# Patient Record
Sex: Female | Born: 1960 | Hispanic: Yes | Marital: Single | Smoking: Never smoker
Health system: Southern US, Community
[De-identification: ages and names within clinical notes are randomized; demographics above are authoritative.]

## PROBLEM LIST (undated history)

## (undated) DIAGNOSIS — T7840XA Allergy, unspecified, initial encounter: Secondary | ICD-10-CM

## (undated) DIAGNOSIS — E785 Hyperlipidemia, unspecified: Secondary | ICD-10-CM

## (undated) HISTORY — PX: HERNIA REPAIR: SHX51

## (undated) HISTORY — DX: Hyperlipidemia, unspecified: E78.5

## (undated) HISTORY — DX: Allergy, unspecified, initial encounter: T78.40XA

---

## 2010-08-10 ENCOUNTER — Other Ambulatory Visit: Payer: Self-pay | Admitting: Internal Medicine

## 2010-08-11 ENCOUNTER — Ambulatory Visit
Admission: RE | Admit: 2010-08-11 | Discharge: 2010-08-11 | Disposition: A | Discharge status: Home / Self Care | Payer: No Typology Code available for payment source | Source: Ambulatory Visit | Attending: Internal Medicine | Admitting: Internal Medicine

## 2011-08-09 IMAGING — US US ABDOMEN COMPLETE
1 series · 13 of 25 positions shown · non-contrast
Comparison: None.

CLINICAL DATA: Fall injury with rib fractures, right upper
quadrant abdominal pain.

COMPLETE ABDOMINAL ULTRASOUND

[Series 1: us abdomen complete · 0.30mm/px · 13 of 71 slices shown]
[im 1/71]
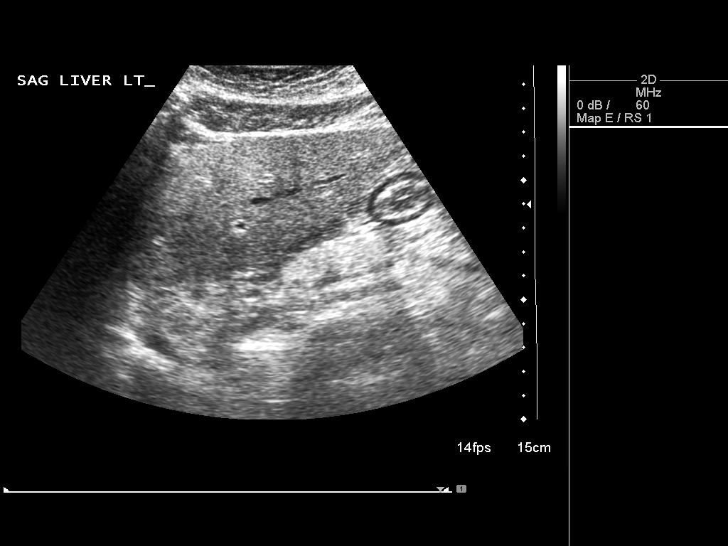
[im 6/71]
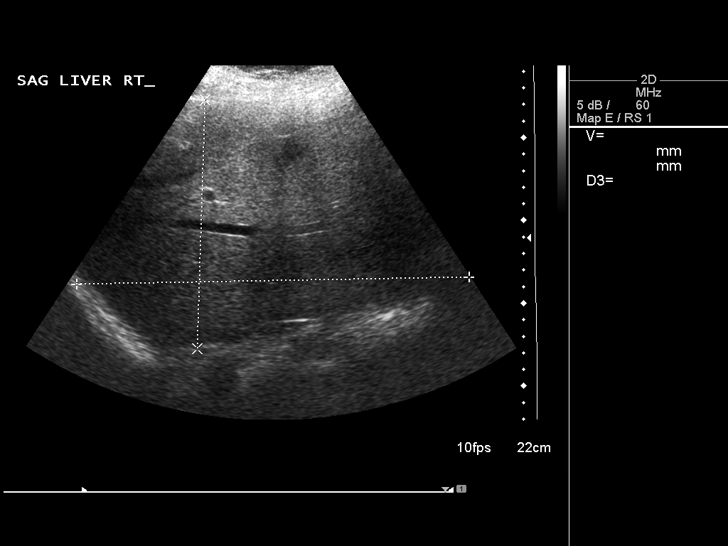
[im 12/71]
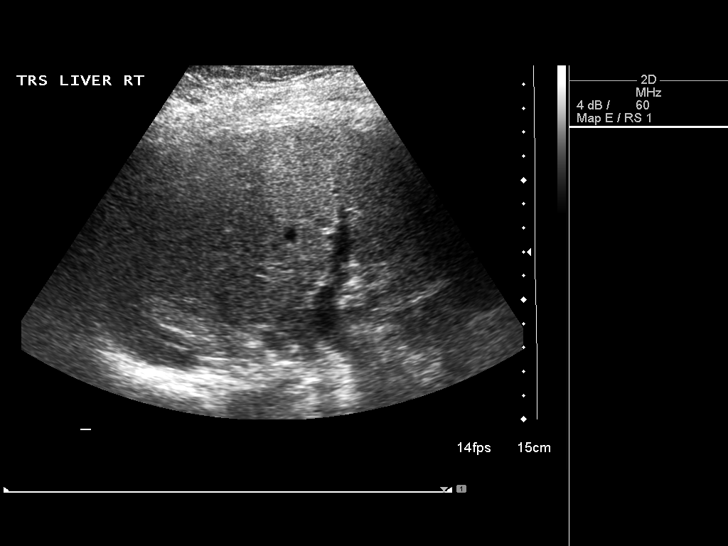
[im 18/71]
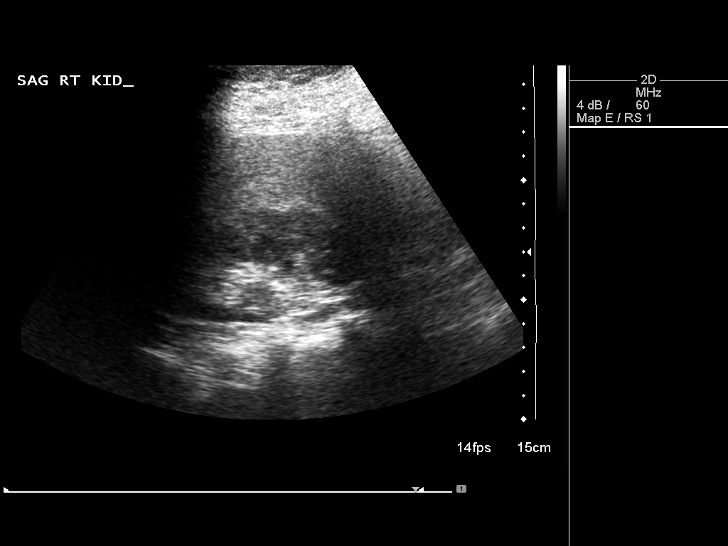
[im 24/71]
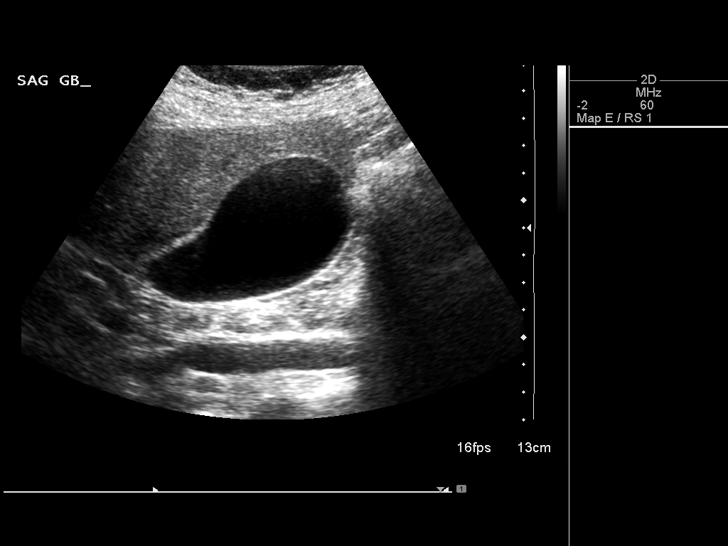
[im 30/71]
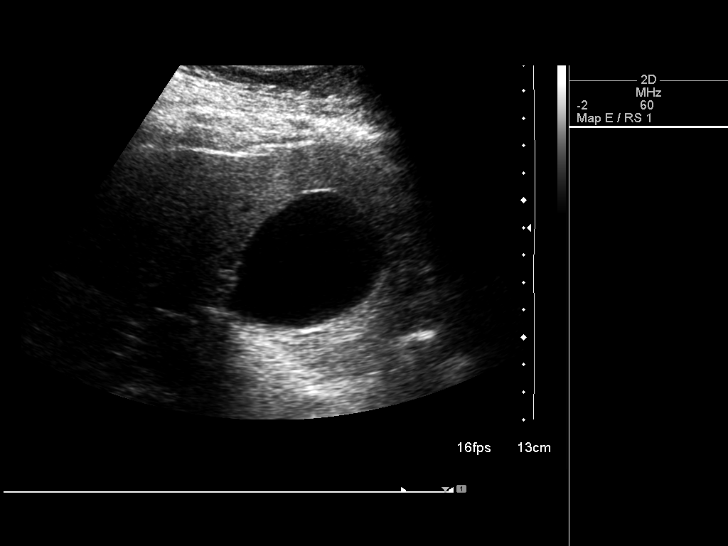
[im 36/71]
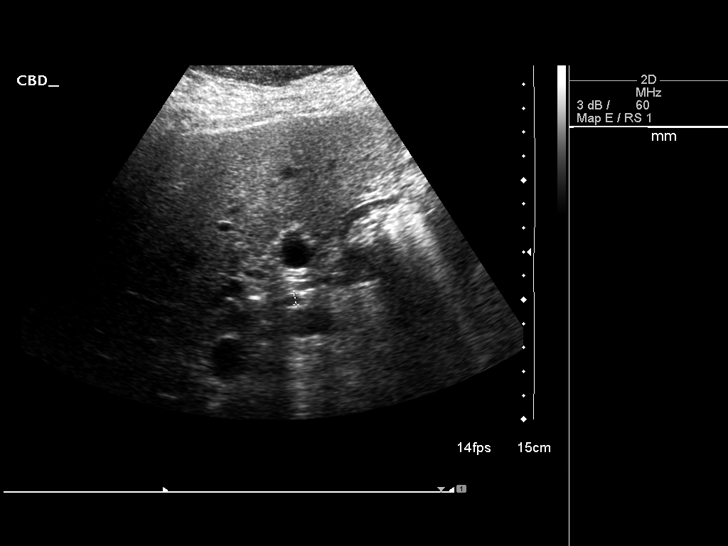
[im 41/71]
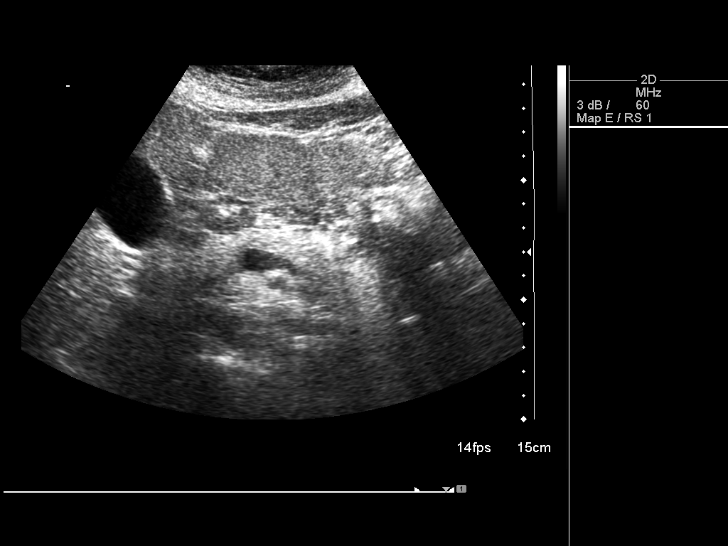
[im 47/71]
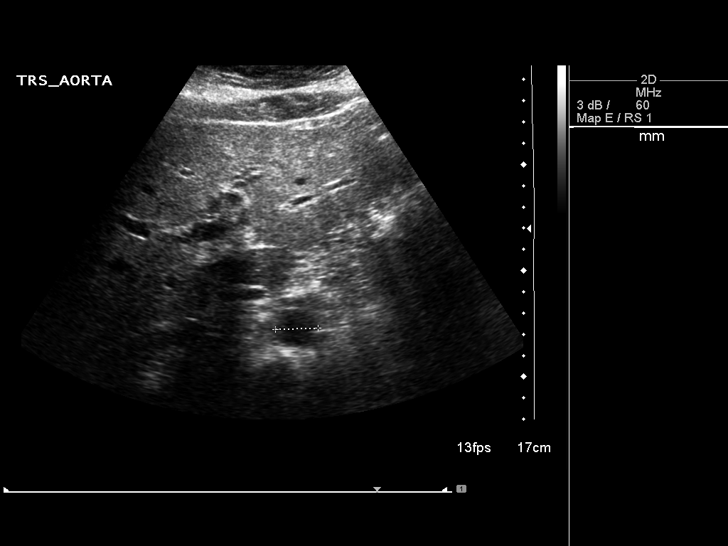
[im 53/71]
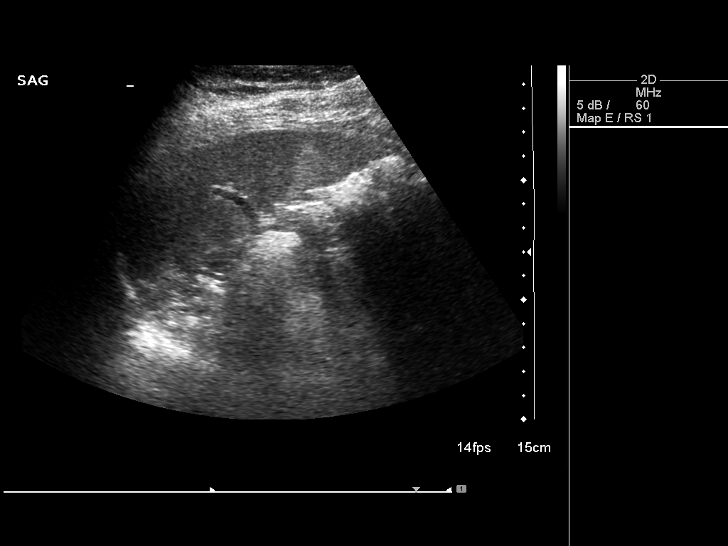
[im 59/71]
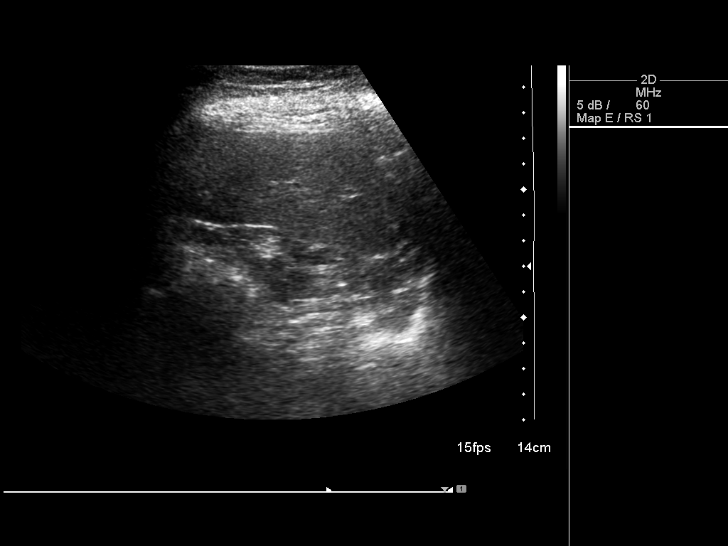
[im 65/71]
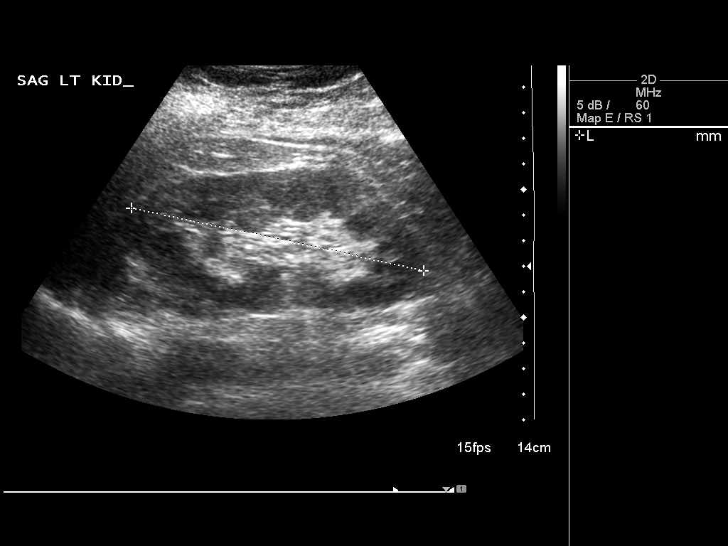
[im 71/71]
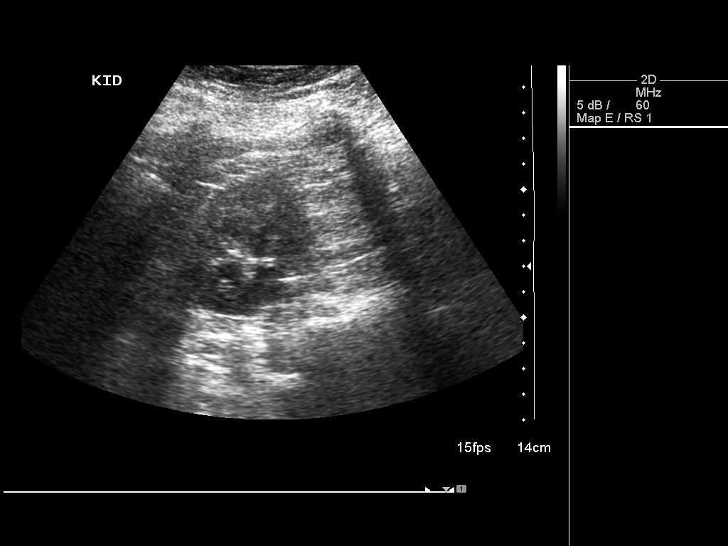

[13 of 25 positions shown; findings below may reference images not displayed]

FINDINGS: Gallbladder:  Sonographically normal without sludge or gallstones
with normal 2 mm wall thickness and no sonographic Murphy's sign
evoked.

Common bile duct:  No dilated intrahepatic or extrahepatic bile
ducts with common bile duct measuring normally at 5 mm.

Liver:  Liver size borderline to slightly enlarged (volume of 5111
ml) with diffuse increase echogenicity of slight fatty change with
no focal lesion.

IVC:  Appears normal.

Pancreas:  Diffuse fatty change with no focal lesion.

Spleen:  Sonographically normal measuring 11.3 cm long.

Right Kidney:  Lower limits of normal in size and otherwise
sonographically normal measuring 9.0 cm long.

Left Kidney:  Sonographically normal measuring 11.7 cm long.

Abdominal aorta:  Limited visualization due to overlying
gastrointestinal gas with maximum visualized abdominal aorta
measuring normally approximately at 2.0 cm.

No free fluid.
IMPRESSION: 1.  Liver size borderline to slightly enlarged with diffuse fatty
infiltration.
2.  Limited visualization of abdominal aorta.
3.  Otherwise, no significant abnormality.

## 2011-12-16 ENCOUNTER — Ambulatory Visit: Payer: Self-pay | Admitting: Physician Assistant

## 2011-12-16 VITALS — BP 147/85 | HR 81 | Temp 98.5°F | Resp 16 | Ht <= 58 in | Wt 176.0 lb

## 2011-12-16 DIAGNOSIS — T148XXA Other injury of unspecified body region, initial encounter: Secondary | ICD-10-CM

## 2011-12-16 DIAGNOSIS — W540XXA Bitten by dog, initial encounter: Secondary | ICD-10-CM

## 2011-12-16 DIAGNOSIS — S81809A Unspecified open wound, unspecified lower leg, initial encounter: Secondary | ICD-10-CM

## 2011-12-16 MED ORDER — AMOXICILLIN-POT CLAVULANATE 875-125 MG PO TABS: 1.0000 | ORAL_TABLET | Freq: Two times a day (BID) | ORAL | Status: DC

## 2011-12-16 NOTE — Progress Notes (Signed)
   Patient ID: Martha Mejia MRN: [id number], DOB: [id number], 51 y.o. Date of Encounter: 12/16/2011, 9:48 AM  Primary Physician: Tally Due, MD  Chief Complaint: Dog bite  HPI: 51 y.o. year old female with history below presents with dog bite to the right thigh. Patient was trying to put the leash and a sweater on her 51 year old [place] poodle this morning to her the dog for a walk. While doing this the dog bit the patient on the right thigh. Patient's son who is here to interpret states the dog is current on all vaccines including rabies. Dog was just at the vet this past Monday. Vet is [facility] on [address]. Unsure of last tetanus vaccine.    Past Medical History  Diagnosis Date  . Allergy      Home Meds: Prior to Admission medications   Not on File    Allergies: No Known Allergies  History   Social History  . Marital Status: Single    Spouse Name: N/A    Number of Children: N/A  . Years of Education: N/A   Occupational History  . Not on file.   Social History Main Topics  . Smoking status: Never Smoker   . Smokeless tobacco: Not on file  . Alcohol Use: No  . Drug Use: No  . Sexually Active: No   Other Topics Concern  . Not on file   Social History Narrative  . No narrative on file     Review of Systems: Constitutional: negative for chills, fever, night sweats, weight changes, or fatigue  Dermatological: see above   Physical Exam: Blood pressure 147/85, pulse 81, temperature 98.5 F (36.9 C), temperature source Oral, resp. rate 16, height 4' 9.5" (1.461 m), weight 176 lb (79.833 kg), SpO2 97.00%., Body mass index is 37.43 kg/(m^2). General: Well developed, well nourished, in no acute distress. Head: Normocephalic, atraumatic, eyes without discharge, sclera non-icteric, nares are without discharge. Bilateral auditory canals clear, TM's are without perforation, pearly grey and translucent with reflective cone of light  bilaterally. Oral cavity moist, posterior pharynx without exudate, erythema, peritonsillar abscess, or post nasal drip.  Neck: Supple. No thyromegaly. Full ROM. No lymphadenopathy. Lungs: Breathing is unlabored. Heart: Regular rate. Msk:  Strength and tone normal for age. Extremities/Skin: Puncture wound to the right quad. No active bleeding. No ecchymosis. No TTP.  Neuro: Alert and oriented X 3. Moves all extremities spontaneously. Gait is normal. CNII-XII grossly in tact. Psych:  Responds to questions appropriately with a normal affect.     ASSESSMENT AND PLAN:  51 y.o. year old female with puncture wound to anterior right thigh secondary to dog bite -TDaP given in office today -Augmentin 875/125 mg 1 po bid #20 no RF -Bite report completed and faxed to [facility] animal control -Wound washed and dressed -Patient's son states the dog is up to date with rabies vaccination. Dog was just at the vet this past Monday and was told all vaccines are current. He will have [facility] in [address] fax [place] a copy of current rabies vaccine on 12/17/11.  -Discussed with Dr. Milus Glazier   Signed, Eula Listen, PA-C 12/16/2011 9:48 AM

## 2012-01-13 ENCOUNTER — Emergency Department (HOSPITAL_COMMUNITY)
Admission: EM | Admit: 2012-01-13 | Discharge: 2012-01-13 | Disposition: A | Discharge status: Home / Self Care | Payer: Self-pay | Attending: Emergency Medicine | Admitting: Emergency Medicine

## 2012-01-13 DIAGNOSIS — Z79899 Other long term (current) drug therapy: Secondary | ICD-10-CM | POA: Insufficient documentation

## 2012-01-13 DIAGNOSIS — J329 Chronic sinusitis, unspecified: Secondary | ICD-10-CM | POA: Insufficient documentation

## 2012-01-13 DIAGNOSIS — R220 Localized swelling, mass and lump, head: Secondary | ICD-10-CM | POA: Insufficient documentation

## 2012-01-13 DIAGNOSIS — J029 Acute pharyngitis, unspecified: Secondary | ICD-10-CM | POA: Insufficient documentation

## 2012-01-13 DIAGNOSIS — J069 Acute upper respiratory infection, unspecified: Secondary | ICD-10-CM | POA: Insufficient documentation

## 2012-01-13 DIAGNOSIS — R221 Localized swelling, mass and lump, neck: Secondary | ICD-10-CM | POA: Insufficient documentation

## 2012-01-13 MED ORDER — AMOXICILLIN-POT CLAVULANATE 875-125 MG PO TABS: 1.0000 | ORAL_TABLET | Freq: Two times a day (BID) | ORAL | Status: DC

## 2012-01-13 NOTE — ED Provider Notes (Signed)
History     CSN: [id number]  Arrival date & time 01/13/12  0905   First MD Initiated Contact with Patient 01/13/12 [phone]      Chief Complaint  Patient presents with  . Allergies    (Consider location/radiation/quality/duration/timing/severity/associated sxs/prior treatment) HPI Comments: 52 year old Spanish-speaking female presents to the emergency department with her son who is translating for her complaining of head congestion, sore throat and productive cough with yellow mucus worsening over the past week and a half. Initially she thought she had allergies, went to her primary care doctor who prescribed her loratadine in nasal spray. States is not making any difference. She has an appointment with an allergist next week. Denies fever, chills, nausea or vomiting. No chest pain shortness of breath. Denies sick contacts.  The history is provided by the patient and a relative. The history is limited by a language barrier.    Past Medical History  Diagnosis Date  . Allergy     Past Surgical History  Procedure Date  . Hernia repair     No family history on file.  History  Substance Use Topics  . Smoking status: Never Smoker   . Smokeless tobacco: Not on file  . Alcohol Use: No    OB History    Grav Para Term Preterm Abortions TAB SAB Ect Mult Living                  Review of Systems  Constitutional: Negative for fever and chills.  HENT: Positive for congestion, sore throat, rhinorrhea, postnasal drip and sinus pressure. Negative for trouble swallowing.   Eyes: Negative.   Respiratory: Positive for cough. Negative for shortness of breath.   Cardiovascular: Negative for chest pain.  Gastrointestinal: Negative for nausea and vomiting.  Genitourinary: Negative.   Musculoskeletal: Negative for myalgias and arthralgias.  Skin: Negative.   Neurological: Negative for dizziness and light-headedness.    Allergies  Review of patient's allergies indicates no known  allergies.  Home Medications   Current Outpatient Rx  Name  Route  Sig  Dispense  Refill  . BACITRACIN-POLYMYXIN B 500-10000 UNIT/GM EX OINT   Topical   Apply 1 application topically 2 (two) times daily.         Marland Kitchen ZICAM ALLERGY RELIEF NA GEL   Nasal   Place 1 application into the nose daily.         Marland Kitchen LORATADINE 10 MG PO TABS   Oral   Take 10 mg by mouth daily.         Marland Kitchen SALINE NASAL SPRAY 0.65 % NA SOLN   Nasal   Place 1 spray into the nose 2 (two) times daily as needed. For congestion         . AMOXICILLIN-POT CLAVULANATE 875-125 MG PO TABS   Oral   Take 1 tablet by mouth 2 (two) times daily. One po bid x 7 days   14 tablet   0   . PRAVASTATIN SODIUM 20 MG PO TABS   Oral   Take 20 mg by mouth daily.           BP 136/74  Pulse 82  Temp 99.5 F (37.5 C) (Oral)  Resp 20  SpO2 95%  Physical Exam  Nursing note and vitals reviewed. Constitutional: She is oriented to person, place, and time. She appears well-developed and well-nourished. No distress.  HENT:  Head: Normocephalic and atraumatic.  Nose: Mucosal edema present. Right sinus exhibits maxillary sinus tenderness and frontal sinus  tenderness. Left sinus exhibits maxillary sinus tenderness and frontal sinus tenderness.  Mouth/Throat: Uvula is midline and mucous membranes are normal. Posterior oropharyngeal erythema present. No posterior oropharyngeal edema.       Post nasal drip present.  Eyes: Conjunctivae normal and EOM are normal. Pupils are equal, round, and reactive to light.  Neck: Normal range of motion. Neck supple.  Cardiovascular: Normal rate, regular rhythm and normal heart sounds.   Pulmonary/Chest: Effort normal and breath sounds normal.  Abdominal: Soft. Bowel sounds are normal. There is no tenderness.  Musculoskeletal: Normal range of motion. She exhibits no edema.  Lymphadenopathy:    She has cervical adenopathy.  Neurological: She is alert and oriented to person, place, and time.    Skin: Skin is warm and dry.  Psychiatric: She has a normal mood and affect. Her behavior is normal.    ED Course  Procedures (including critical care time)  Labs Reviewed - No data to display No results found.   1. Sinusitis   2. URI (upper respiratory infection)       MDM  52 y/o female with sinusitis/URI. Symptoms present for 1-2 weeks unrelieved by conservative measures. I have no clinical suspicion for flu. Lungs clear. Marked sinus tenderness. Post nasal drip present along with mucosal edema. Advised her to continue nasal spray and saline rinses. Rx augmentin. She will f/u with PCP and allergist. Patient and son state their understanding of plan and are agreeable.        Trevor Mace, PA-C 01/13/12 [phone]

## 2012-01-13 NOTE — ED Provider Notes (Signed)
Medical screening examination/treatment/procedure(s) were performed by non-physician practitioner and as supervising physician I was immediately available for consultation/collaboration.  Marinna Blane, MD 01/13/12 [id number] 

## 2012-01-13 NOTE — ED Notes (Signed)
Pt reports congestion; no fevers; dry cough. Pt takes loratadine and zicam and nasal spraysOTC with no relief. Pt saw GP last week and she is to see allergist.

## 2013-08-25 ENCOUNTER — Ambulatory Visit (INDEPENDENT_AMBULATORY_CARE_PROVIDER_SITE_OTHER): Payer: Self-pay | Admitting: Gynecology

## 2013-08-25 ENCOUNTER — Other Ambulatory Visit (HOSPITAL_COMMUNITY)
Admission: RE | Admit: 2013-08-25 | Discharge: 2013-08-25 | Disposition: A | Discharge status: Home / Self Care | Payer: Self-pay | Source: Ambulatory Visit | Attending: Gynecology | Admitting: Gynecology

## 2013-08-25 ENCOUNTER — Encounter: Payer: Self-pay | Admitting: Gynecology

## 2013-08-25 VITALS — BP 122/74 | Ht 59.75 in | Wt 164.4 lb

## 2013-08-25 DIAGNOSIS — R945 Abnormal results of liver function studies: Secondary | ICD-10-CM

## 2013-08-25 DIAGNOSIS — Z01419 Encounter for gynecological examination (general) (routine) without abnormal findings: Secondary | ICD-10-CM | POA: Insufficient documentation

## 2013-08-25 DIAGNOSIS — R739 Hyperglycemia, unspecified: Secondary | ICD-10-CM | POA: Insufficient documentation

## 2013-08-25 DIAGNOSIS — Z780 Asymptomatic menopausal state: Secondary | ICD-10-CM

## 2013-08-25 DIAGNOSIS — Z1151 Encounter for screening for human papillomavirus (HPV): Secondary | ICD-10-CM | POA: Insufficient documentation

## 2013-08-25 DIAGNOSIS — R635 Abnormal weight gain: Secondary | ICD-10-CM

## 2013-08-25 DIAGNOSIS — N951 Menopausal and female climacteric states: Secondary | ICD-10-CM

## 2013-08-25 DIAGNOSIS — E785 Hyperlipidemia, unspecified: Secondary | ICD-10-CM

## 2013-08-25 DIAGNOSIS — Z8639 Personal history of other endocrine, nutritional and metabolic disease: Secondary | ICD-10-CM | POA: Insufficient documentation

## 2013-08-25 DIAGNOSIS — R7309 Other abnormal glucose: Secondary | ICD-10-CM

## 2013-08-25 DIAGNOSIS — R7989 Other specified abnormal findings of blood chemistry: Secondary | ICD-10-CM

## 2013-08-25 NOTE — Addendum Note (Signed)
Addended by: Richardson ChiquitoWILKINSON, KARI S on: 08/25/2013 04:26 PM   Modules accepted: Orders

## 2013-08-25 NOTE — Patient Instructions (Addendum)
Autoexamen de [employer]  Chief Executive Officer) El autoexamen de mama le permite advertir problemas en la mama con anticipacin, cuando todava son pequeos. Haga un autoexamen de las mamas:   [employer], 5 a 7 [address] de tener el perodo ([place]).  En la misma altura de cada mes si ya no tiene ms su perodo. Busque:  Diferencias entre los senos (tamao, forma o posicin).  Cambios en el tamao o forma del seno.  Sale sangre o lquido por los pezones.  Modificaciones en los pezones (hoyuelos, movimiento del pezn).   Cambio en el color o la textura de la piel (enrojecimiento, reas escamosas). Trate de palpar:   Engrosamientos.  Bultos.  Hoyuelos.  Cualquier otro cambio. CMO REALIZAR EL AUTOEXAMEN DE MAMAS  Observe sus senos y pezones.  1. Qutese toda la ropa por encima de la cintura. 2. Prese frente a un espejo en una habitacin con buena iluminacin. 3.  Ponga las [employer] caderas y [address] las manos [place]. Palpe las mamas.  1. Acustese boca arriba o de pie en la ducha o la baera. Si est en la ducha o baera, moje y enjabone sus manos. 2. Coloque el brazo derecho por encima de la cabeza. 3. Coloque la mano izquierda en el rea de la axila derecha. 4. Haga pequeos crculos con las yemas de los dedos (no las puntas) de los 3 dedos medios. Presione ligeramente y luego haga una presin [place] y [place]. 5. Mueva los dedos un poco ms bajo y haga pequeos crculos en 3 presiones (ligera, media y [place]). 6. Siga moviendo los dedos hacia abajo y haciendo crculos Civil Service fast streamer a la parte inferior de la mama. 7. Mueva sus dedos de a un dedo de ancho hacia el centro de su cuerpo. 8. Contine haciendo crculos, esta vez hacia arriba hasta llegar a la parte inferior del cuello. 9. Mueva sus dedos de a un dedo de ancho hacia el centro de su cuerpo. 10. Haga crculos hacia abajo a partir de la parte inferior del cuello. Haga crculos hacia arriba a partir de  la parte inferior de la [place]. Detngase cuando llegue a la mitad del pecho. 11.  Repita estos pasos en la otra mama. Escriba lo que observe y se sienta normal para cada mama. Tambin anote cualquier cambio que note.  SOLICITE AYUDA DE INMEDIATO SI:   Observa cambios en las mamas o los pezones.  Observa cambios en la piel.  Tiene una secrecin anormal en los pezones.  Usted tiene un bulto nuevo.  Tiene zonas con endurecimientos inusuales. Document Released: 01/27/2010 Document Revised: 12/12/2011 [facility] Patient Information 2015 [place], [place]. This information is not intended to replace advice given to you by your health care provider. Make sure you discuss any questions you have with your health care provider.   Colonoscopa (Colonoscopy) Neomia Dear colonoscopa es un examen que se realiza para examinar todo el intestino grueso (colon). Este examen puede ayudar a Engineer, manufacturing problemas, como tumores, plipos, inflamacin y reas de Restaurant manager, fast food. El examen dura aproximadamente 1hora.  INFORME A SU MDICO:   Cualquier alergia que tenga.  Todos los Walt Disney, incluidos vitaminas, hierbas, gotas oftlmicas, cremas y [address] de [address].  Problemas previos que usted o los [employer] de su familia hayan tenido con el uso de anestsicos.  Enfermedades de [employer].  Cirugas previas.  Enfermedades patolgicas. RIESGOS Y COMPLICACIONES  En general, se trata de un procedimiento seguro. Sin embargo, Tree surgeon procedimiento, pueden surgir complicaciones. Las  complicaciones posibles son:  Hemorragias.  Desgarro o ruptura de la pared del colon.  Reaccin a los Systems developer.  Infeccin (raro). ANTES DEL PROCEDIMIENTO   Consulte a su mdico si debe cambiar o suspender los medicamentos que toma habitualmente.  Posiblemente se le recete una preparacin del colon por va oral. Esto incluye beber una gran cantidad de lquido medicinal  desde el da anterior a su procedimiento. El lquido har que elimine muchas heces blandas hasta que sean casi claras o de color verdoso claro. De esta manera limpiar el colon para prepararlo para el procedimiento.  No coma ni beba nada ms una vez que haya comenzado con la preparacin del colon, a menos que el mdico le indique que es seguro [place].  Pdale a alguna persona que la lleve a su casa luego del procedimiento. PROCEDIMIENTO   Le administrarn un medicamento para que pueda relajarse (sedante).  Se recostar de costado con las rodillas flexionadas.  Se insertar un tubo largo y flexible con Neomia Dear luz y Neomia Dear cmara en el extremo (colonoscopio) a travs del recto y dentro del colon. La cmara enviar el video hacia una pantalla de computadora a medida que se vaya moviendo por el colon. El colonoscopio tambin libera dixido de carbono para inflar el colon. Esto ayuda a que el mdico pueda ver mejor el rea.  Durante el examen, es posible que su mdico tome una pequea muestra de tejido (biopsia) para examinarla bajo el microscopio, si se encuentran anormalidades.  El examen finaliza cuando se ha examinado todo el colon. DESPUS DEL PROCEDIMIENTO   No conduzca vehculos durante las 24horas posteriores al examen.  Es posible que encuentre una pequea cantidad de sangre en la materia fecal.  Gretchen Short tenga cantidades moderadas de gases y calambres o hinchazn abdominales leves. Esto se produce a causa del gas utilizado para inflar el colon durante el examen.  Pregunte cundo estarn [employer] del examen y cmo los obtendr. Asegrese de [place]. Document Released: 10/04/2004 Document Revised: 10/15/2012 [facility] Patient Information 2015 [place], [place]. This information is not intended to replace advice given to you by your health care provider. Make sure you discuss any questions you have with your health care provider.                                                     Control del colesterol  Los niveles de colesterol en el organismo estn determinados significativamente por su dieta. Los niveles de colesterol tambin se relacionan con la enfermedad cardaca. El material que sigue ayuda a Software engineer relacin y a Chiropractor qu puede hacer para mantener su corazn sano. No todo el colesterol es [place]. Las lipoprotenas de baja densidad (LDL) forman el colesterol "malo". El colesterol malo puede ocasionar depsitos de grasa que se acumulan en el interior de las arterias. Las lipoprotenas de alta densidad (HDL) es el colesterol "bueno". Ayuda a remover el colesterol LDL "malo" de la [place]. El colesterol es un factor de riesgo muy importante para la enfermedad cardaca. Otros factores de riesgo son la hipertensin arterial, el hbito de fumar, el estrs, la herencia y [place].   El msculo cardaco obtiene el suministro de sangre a travs de las arterias coronarias. Si su colesterol LDL ("malo") est elevado y el HDL ("bueno") es [place], tiene un  factor de riesgo para que se formen depsitos de grasa en las arterias coronarias (los vasos sanguneos que suministran sangre al corazn). Esto hace que haya menos lugar para que la sangre circule. Sin la suficiente sangre y oxgeno, el msculo cardaco no puede funcionar correctamente, y usted podr sentir dolores en el pecho (angina pectoris). Cuando una arteria coronaria se cierra completamente, una parte del msculo cardaco puede morir (infarto de miocardio).  CONTROL DEL COLESTEROL Cuando el profesional que lo asiste enva la sangre al laboratorio para Artistconocer el nivel de colesterol, puede realizarle tambin un perfil completo de los lpidos. Con esta prueba, se puede determinar la cantidad total de colesterol, as como los niveles de LDL y HDL. Los triglicridos son un tipo de grasa que circula en la sangre y que tambin puede utilizarse para determinar el riesgo de enfermedad cardaca. En la siguiente tabla se  establecen los nmeros ideales: Prueba: Colesterol total  Menos de 200 mg/dl.  Prueba: LDL "colesterol malo"  Menos de 100 mg/dl.   Menos de 70 mg/dl si tiene riesgo muy elevado de sufrir un ataque cardaco o muerte cardaca sbita.  Prueba: HDL "colesterol bueno"  Mujeres: Ms de 50 mg/dl.   Hombres: Ms de 40 mg/dl.  Prueba: Trigliceridos  Menos de 150 mg/dl.    CONTROL DEL COLESTEROL CON DIETA Aunque factores como el ejercicio y el estilo de vida son importantes, la "primera lnea de ataque" es la dieta. Esto se debe a que se sabe que ciertos alimentos hacen subir el colesterol y otros lo Mexicobajan. El objetivo debe ser ConAgra Foodsequilibrar los alimentos, de modo que tengan un efecto sobre el colesterol y, an ms importante, Microbiologistreemplazar las grasas saturadas y trans con otros tipos de grasas, como las monoinsaturadas y las poliinsaturadas y cidos grasos omega-3 . En promedio, una persona no debe consumir ms de 15 a 17 g de grasas saturadas por C.H. Robinson Worldwideda. Las grasas saturadas y trans se consideran grasas "malas", ya que elevan el colesterol LDL. Las grasas saturadas se encuentran principalmente en productos animales como carne, Nomemanteca y crema. Pero esto no significa que usted Marketing executivedebe sacrificar todas sus comidas favoritas. Actualmente, como lo muestra el cuadro que figura al final de este documento, hay sustitutos de buen sabor, bajos en grasas y en colesterol, para la mayora de los alimentos que a usted Musicianle gusta comer. Elija aquellos alimentos alternativos que sean bajos en grasas o sin grasas. Elija cortes de carne del cuarto trasero o lomo ya que estos cortes son los que tienen menor cantidad de grasa y Oncologistcolesterol. El pollo (sin piel), el pescado, la carne de ternera, y la Saladopechuga de Confluencepavo molida son excelentes opciones. Elimine las carnes Tyson Foodsgrasosas como los hotdogs o el salami. Los Federal-Mogulmariscos tienen poco o nada de grasas saturadas. Cuando consuma carne Cal[place]magra, carne de aves de corral, o pescado, hgalo en  porciones de 85 gramos (3 onzas). Las grasas trans tambin se llaman "aceites parcialmente hidrogenados". Son aceites manipulados cientficamente de [place]modo que son slidos a Publishing rights managertemperatura ambiente, tienen una larga vida y Glass blower/designermejoran el sabor y la textura de los alimentos a los que se Scientist, clinical (histocompatibility and immunogenetics)agregan. Las grasas trans se encuentran en la [place]villemargarina, Daltonmasitas, crackers y alimentos horneados.  Para hornear y cocinar, el aceite es un excelente sustituto para la The Hideoutmantequilla. Los aceites monoinsaturados tienen un beneficio particular, ya que se cree que disminuyen el colesterol LDL (colesterol malo) y elevan el HDL. Deber evitar los aceites tropicales saturados como el de coco y el de Aptospalma.  Recuerde, adems, que puede comer sin restricciones los grupos de alimentos que son naturalmente libres de grasas saturadas y Neurosurgeon trans, entre los que se incluyen el pescado, las frutas (excepto el [place]), verduras, frijoles, cereales (cebada, arroz, [place], trigo) y las pastas (sin salsas con crema)   IDENTIFIQUE LOS ALIMENTOS QUE DISMINUYEN EL COLESTEROL  Pueden disminuir el colesterol las fibras solubles que estn en las frutas, como las [place], en los vegetales como el brcoli, las patatas y las zanahorias; en las legumbres como frijoles, guisantes y Therapist, occupational; y en los cereales como la cebada. Los alimentos fortificados con fitosteroles tambin Engineer, production. Debe consumir al menos 2 g de estos alimentos a diario para Financial planner de disminucin de [place].  En el supermercado, lea las etiquetas de los envases para identificar los alimentos bajos en grasas saturadas, libres de grasas trans y bajos en [place], . Elija quesos que tengan solo de 2 a 3 g de grasa saturada por onza (28,35 g). Use una margarina que no dae el corazn, [place] de grasas trans o aceite parcialmente hidrogenado. Al comprar alimentos horneados (galletitas dulces y Gaffer) evite el aceite parcialmente hidrogenado. Los panes y bollos  debern ser de granos enteros (harina de maz o de avena entera, en lugar de "harina" o "harina enriquecida"). Compre sopas en lata que no sean cremosas, con bajo contenido de sal y sin grasas adicionadas.   TCNICAS DE PREPARACIN DE LOS ALIMENTOS  Nunca fra los alimentos en aceite abundante. Si debe frer, hgalo en poco aceite y removiendo [place], porque as se utilizan muy pocas grasas, o utilice un spray antiadherente. Cuando le sea posible, hierva, hornee o ase las carnes y cocine los vegetales al vapor. En vez de [employer] con mantequilla o [place], utilice limn y hierbas, pur de Psychologist, educational y canela (para las calabazas y batatas), yogurt y salsa descremados y aderezos para ensaladas bajos en contenido graso.   BAJO EN GRASAS SATURADAS / SUSTITUTOS BAJOS EN GRASA  Carnes / Grasas saturadas (g)  Evite: Bife, corte graso (3 oz/85 g) / 11 g   Elija: Bife, corte magro (3 oz/85 g) / 4 g   Evite: Hamburguesa (3 oz/85 g) / 7 g   Elija:  Hamburguesa magra (3 oz/85 g) / 5 g   Evite: Jamn (3 oz/85 g) / 6 g   Elija:  Jamn magro (3 oz/85 g) / 2.4 g   Evite: Pollo, con piel (3 oz/85 g), Carne oscura / 4 g   Elija:  Pollo, sin piel (3 oz/85 g), Carne oscura / 2 g   Evite: Pollo, con piel (3 oz/85 g), Carne magra / 2.5 g   Elija: Pollo, sin piel (3 oz/85 g), Carne magra / 1 g  Lcteos / Grasas saturadas (g)  Evite: Leche entera (1 taza) / 5 g   Elija: Leche con bajo contenido de grasa, 2% (1 taza) / 3 g   Elija: Leche con bajo contenido de grasa, 1% (1 taza) / 1.5 g   Elija: Leche descremada (1 taza) / 0.3 g   Evite: Queso duro (1 oz/28 g) / 6 g   Elija: Queso descremado (1 oz/28 g) / 2-3 g   Evite: Queso cottage, 4% grasa (1 taza)/ 6.5 g   Elija: Queso cottage con bajo contenido de grasa, 1% grasa (1 taza)/ 1.5 g   Evite: Helado (1 taza) / 9 g   Elija: Sorbete (1 taza) / 2.5 g   Elija: Yogurt helado sin  contenido de grasa (1 taza) / 0.3 g   Elija:  Barras de fruta congeladas / vestigios   Evite: Crema batida (1 cucharada) / 3.5 g   Elija: Batidos glac sin lcteos (1 cucharada) / 1 g  Condimentos / Grasas saturadas (g)  Evite: Mayonesa (1 cucharada) / 2 g   Elija: Mayonesa con bajo contenido de grasa (1 cucharada) / 1 g   Evite: Manteca (1 cucharada) / 7 g   Elija: Margarina extra light (1 cucharada) / 1 g   Evite: Aceite de coco (1 cucharada) / 11.8 g   Elija: Aceite de oliva (1 cucharada) / 1.8 g   Elija: Aceite de maz (1 cucharada) / 1.7 g   Elija: Aceite de crtamo (1 cucharada) / 1.2 g   Elija: Aceite de girasol (1 cucharada) / 1.4 g   Elija: Aceite de soja (1 cucharada) / 2.4 g   Elija: Aceite de canola (1 cucharada) / 1 g  Document Released: 12/25/2004 Document Revised: 09/06/2010 [facility] Patient Information 2012 [place], [place]. Ejercicios para perder peso (Exercise to Lose Weight) La actividad fsica y Neomia Dear dieta saludable ayudan a perder peso. El mdico podr sugerirle ejercicios especficos. IDEAS Y CONSEJOS PARA HACER EJERCICIOS  Elija opciones econmicas que disfrute hacer , como caminar, andar en bicicleta o los vdeos para ejercitarse.   Utilice las Microbiologist del ascensor.   Camine durante la hora del almuerzo.   Estacione el auto lejos del lugar de [place] o [place].   Concurra a un gimnasio o tome clases de gimnasia.   Comience con 5  10 minutos de actividad fsica por da. Ejercite hasta 30 minutos, 4 a 6 das por [address]   Utilice zapatos que tengan un buen soporte y ropas cmodas.   Elongue antes y despus de Company secretary.   Ejercite hasta que aumente la respiracin y el corazn palpite rpido.   Beba agua extra cuando ejercite.   No haga ejercicio Firefighter, sentirse mareado o que le falte mucho el aire.  La actividad fsica puede quemar alrededor de 150 caloras.  Correr 20 cuadras en 15 minutos.   Jugar vley durante 45 a 60 minutos.   Limpiar y encerar el auto  durante 45 a 60 minutos.   Jugar ftbol americano de toque.   Caminar 25 cuadras en 35 minutos.   Empujar un cochecito 20 cuadras en 30 minutos.   Jugar baloncesto durante 30 minutos.   Rastrillar hojas secas durante 30 minutos.   Andar en bicicleta 80 cuadras en 30 minutos.   Caminar 30 cuadras en 30 minutos.   Bailar durante 30 minutos.   Quitar la nieve con una pala durante 15 minutos.   Nadar vigorosamente durante 20 minutos.   Subir escaleras durante 15 minutos.   Andar en bicicleta 60 cuadras durante 15 minutos.   Arreglar el jardn entre 30 y 45 minutos.   Saltar a la soga durante 15 minutos.   Limpiar vidrios o pisos durante 45 a 60 minutos.  Document Released: 03/31/2010 Document Revised: 09/06/2010 [facility] Patient Information 2012 [place], [place].

## 2013-08-25 NOTE — Progress Notes (Signed)
Martha Mejia 09/16/1960 [id number]   History:    53 y.o.  for annual gyn exam who is a new patient to the practice. The patient very anxious and nervous on today's exam. She stated that her last gynecological exam was in [place] 6 years ago. Patient denies any previous history of abnormal Pap smear. She stated that she reached menopause at the age of 30. She has never been on any hormone replacement therapy. She is not sexually active. Patient with no prior mammograms or colonoscopy. She had gone to an urgent care clinic here in [facility] and she did bring labs that were drawn on March 5 of this year which were nonfasting.  Blood sugar 105 SGPT elevated at 42 Lipid profile LDL elevated at 1:30, total cholesterol 214 Hemoglobin A1c elevated at 6.2 Normal TSH, and CBC and metabolic panel  There was a note on her lab results that she was going to be started on Mevacor 10 mg daily but patient never started the medication and was to return for followup in 3 months as well as in 1 month to recheck her hemoglobin A 1C and she did not followup.  Past medical history,surgical history, family history and social history were all reviewed and documented in the EPIC chart.  Gynecologic History No LMP recorded. Patient is postmenopausal. Contraception: post menopausal status Last Pap: 6 years ago. Results were: Patient states that it was normal in [place] Last mammogram: No previous study. Results were: No previous study  Obstetric History OB History  Gravida Para Term Preterm AB SAB TAB Ectopic Multiple Living  3 1   2  2   1     # Outcome Date GA Lbr Len/2nd Weight Sex Delivery Anes PTL Lv  3 TAB           2 TAB           1 PAR                ROS: A ROS was performed and pertinent positives and negatives are included in the history.  GENERAL: No fevers or chills. HEENT: No change in vision, no earache, sore throat or sinus congestion. NECK: No pain or stiffness.  CARDIOVASCULAR: No chest pain or pressure. No palpitations. PULMONARY: No shortness of breath, cough or wheeze. GASTROINTESTINAL: No abdominal pain, nausea, vomiting or diarrhea, melena or bright red blood per rectum. GENITOURINARY: No urinary frequency, urgency, hesitancy or dysuria. MUSCULOSKELETAL: No joint or muscle pain, no back pain, no recent trauma. DERMATOLOGIC: No rash, no itching, no lesions. ENDOCRINE: No polyuria, polydipsia, no heat or cold intolerance. No recent change in weight. HEMATOLOGICAL: No anemia or easy bruising or bleeding. NEUROLOGIC: No headache, seizures, numbness, tingling or weakness. PSYCHIATRIC: No depression, no loss of interest in normal activity or change in sleep pattern.     Exam: chaperone present  BP 122/74  Ht 4' 11.75" (1.518 m)  Wt 164 lb 6.4 oz (74.571 kg)  BMI 32.36 kg/m2  Body mass index is 32.36 kg/(m^2).  General appearance : Well developed well nourished female. No acute distress HEENT: Neck supple, trachea midline, no carotid bruits, no thyroidmegaly Lungs: Clear to auscultation, no rhonchi or wheezes, or rib retractions  Heart: Regular rate and rhythm, no murmurs or gallops Breast:Examined in sitting and supine position were symmetrical in appearance, no palpable masses or tenderness,  no skin retraction, no nipple inversion, no nipple discharge, no skin discoloration, no axillary or supraclavicular lymphadenopathy Abdomen: no palpable  masses or tenderness, no rebound or guarding Extremities: no edema or skin discoloration or tenderness  Pelvic: Vaginismus  Bartholin, Urethra, Skene Glands: Within normal limits             Vagina: No gross lesions or discharge  Cervix: No gross lesions or discharge  Uterus  anteverted, normal size, shape and consistency, non-tender and mobile  Adnexa  Without masses or tenderness  Anus and perineum  normal   Rectovaginal  normal sphincter tone without palpated masses or tenderness             Hemoccult  will provide her with Hemoccult cards     Assessment/Plan:  53 y.o. female for annual exam with no gynecological exam in 6 years. No prior history of mammogram or colonoscopy. Patient will be directed to get both of these studies done. Patient with history of elevated liver function tests and hyperlipidemia did not followup at the urgent care as had been recommended. She is going to return to the office later in the week for a SGOT, SGPT, fasting lipid profile and fasting blood sugar. Pap smear was done today. We'll await these results and manage accordingly. We discussed importance of monthly self breast examination.  Discussed importance of calcium vitamin D and regular exercise for osteoporosis prevention. This year she will need a bone density study. All the above was discussed with the patient in detail and in Spanish.  Note: This dictation was prepared with  Dragon/digital dictation along withSmart phrase technology. Any transcriptional errors that result from this process are unintentional.   Ok EdwardsFERNANDEZ,Araina Butrick H MD, 4:03 PM 08/25/2013

## 2013-08-26 ENCOUNTER — Other Ambulatory Visit: Payer: Self-pay

## 2013-08-26 ENCOUNTER — Other Ambulatory Visit: Payer: Self-pay | Admitting: Gynecology

## 2013-08-26 DIAGNOSIS — E7800 Pure hypercholesterolemia, unspecified: Secondary | ICD-10-CM

## 2013-08-26 LAB — LIPID PANEL
Cholesterol: 208 mg/dL — ABNORMAL HIGH (ref 0–200)
HDL: 51 mg/dL (ref >39)
LDL CALC: 131 mg/dL — AB (ref 0–99)
Total CHOL/HDL Ratio: 4.1 Ratio
Triglycerides: 130 mg/dL (ref <150)
VLDL: 26 mg/dL (ref 0–40)

## 2013-08-26 LAB — AST: AST: 23 U/L (ref 0–37)

## 2013-08-26 LAB — GLUCOSE, RANDOM: Glucose, Bld: 91 mg/dL (ref 70–99)

## 2013-08-26 LAB — ALT: ALT: 24 U/L (ref 0–35)

## 2013-08-27 LAB — CYTOLOGY - PAP

## 2013-11-09 ENCOUNTER — Encounter: Payer: Self-pay | Admitting: Gynecology

## 2013-12-25 ENCOUNTER — Ambulatory Visit: Payer: Self-pay | Admitting: Podiatry

## 2014-01-15 ENCOUNTER — Ambulatory Visit (INDEPENDENT_AMBULATORY_CARE_PROVIDER_SITE_OTHER): Payer: Self-pay | Admitting: Podiatry

## 2014-01-15 ENCOUNTER — Ambulatory Visit: Payer: Self-pay | Admitting: Podiatry

## 2014-01-15 ENCOUNTER — Encounter: Payer: Self-pay | Admitting: Podiatry

## 2014-01-15 VITALS — BP 157/87 | HR 77 | Resp 13 | Ht 59.84 in | Wt 166.0 lb

## 2014-01-15 DIAGNOSIS — M79672 Pain in left foot: Secondary | ICD-10-CM

## 2014-01-15 DIAGNOSIS — Q828 Other specified congenital malformations of skin: Secondary | ICD-10-CM

## 2014-01-15 NOTE — Progress Notes (Signed)
   Subjective:    Patient ID: Martha Mejia, female    DOB: 11/06/1960, 54 y.o.   MRN: [id number]  HPI 54 year old female presents the office today with a translator for complaints of a painful lesion on the left foot on the ball of her foot for which she points the third MTPJ area. She states of this lesion been present for approximately one year and has been increasing in symptoms. She said no prior treatment. Denies any recent swelling or redness from around the area and denies any drainage. No other complaints at this time.   Review of Systems  HENT: Positive for congestion and sinus pressure.   All other systems reviewed and are negative.      Objective:   Physical Exam AAO x3, NAD DP/PT pulses palpable bilaterally, CRT less than 3 seconds Protective sensation intact with Simms Weinstein monofilament, vibratory sensation intact, Achilles tendon reflex intact On the left plantar third metatarsophalangeal joint there is a punctate hyperkeratotic lesion. There is discomfort directly upon palpation of this lesion. Upon debridement there is no pinpoint bleeding and there is no evidence of verruca. No foreign bodies identified. No surrounding erythema, drainage/purulence, or any other clinical signs of infection. No other open lesions or pre-ulcerative lesions are identified. There is a hyperpigmented lesion on the right medial heel area which has regular borders and the color is uniform. Per the patient, it has been present since birth and has not changed.  No other areas of tenderness palpation or pain with vibratory sensation. MMT 5/5, ROM WNL.  No overlying edema, erythema, increase in warmth to bilateral lower extremities.  No pain with calf compression, swelling, warmth, erythema.       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 -Lesion was sharply  debrided without complication/bleeding to patient comfort. There is no evidence of verruca identified. -A doughnut pad was applied around the area followed by Salinocaine and an occlusive dressing. Instructions were given to the patient's first went to wash the area off or sooner if there is any discomfort to the area. If the area blisters discussed she can apply antibiotic ointment over the area and keep it covered. Monitoring signs or symptoms of infection. Discussed the area will likely recur. -Follow-up as needed. In the meantime, encouraged to call the office with any questions, concerns, change in symptoms.

## 2014-10-25 ENCOUNTER — Ambulatory Visit: Payer: Self-pay | Admitting: Pediatrics

## 2014-12-22 ENCOUNTER — Other Ambulatory Visit: Payer: Self-pay | Admitting: Neurology

## 2015-02-08 ENCOUNTER — Ambulatory Visit: Payer: Self-pay | Attending: Family Medicine

## 2015-03-10 ENCOUNTER — Ambulatory Visit (INDEPENDENT_AMBULATORY_CARE_PROVIDER_SITE_OTHER): Payer: No Typology Code available for payment source | Admitting: Family Medicine

## 2015-03-10 ENCOUNTER — Encounter: Payer: Self-pay | Admitting: Family Medicine

## 2015-03-10 VITALS — BP 147/84 | HR 71 | Temp 98.5°F | Resp 14 | Ht 59.0 in | Wt 180.0 lb

## 2015-03-10 DIAGNOSIS — IMO0001 Reserved for inherently not codable concepts without codable children: Secondary | ICD-10-CM

## 2015-03-10 DIAGNOSIS — Z1239 Encounter for other screening for malignant neoplasm of breast: Secondary | ICD-10-CM

## 2015-03-10 DIAGNOSIS — R030 Elevated blood-pressure reading, without diagnosis of hypertension: Secondary | ICD-10-CM

## 2015-03-10 DIAGNOSIS — Z8639 Personal history of other endocrine, nutritional and metabolic disease: Secondary | ICD-10-CM

## 2015-03-10 DIAGNOSIS — Z0000 Encounter for general adult medical examination without abnormal findings: Secondary | ICD-10-CM

## 2015-03-10 DIAGNOSIS — Z789 Other specified health status: Secondary | ICD-10-CM

## 2015-03-10 DIAGNOSIS — E669 Obesity, unspecified: Secondary | ICD-10-CM

## 2015-03-10 DIAGNOSIS — L700 Acne vulgaris: Secondary | ICD-10-CM

## 2015-03-10 LAB — COMPLETE METABOLIC PANEL WITH GFR
ALBUMIN: 4.3 g/dL (ref 3.6–5.1)
ALK PHOS: 60 U/L (ref 33–130)
ALT: 22 U/L (ref 6–29)
AST: 38 U/L — AB (ref 10–35)
BUN: 21 mg/dL (ref 7–25)
CO2: 21 mmol/L (ref 20–31)
CREATININE: 0.47 mg/dL — AB (ref 0.50–1.05)
Calcium: 9.1 mg/dL (ref 8.6–10.4)
Chloride: 106 mmol/L (ref 98–110)
GFR, Est African American: >89 mL/min (ref >=60)
GFR, Est Non African American: >89 mL/min (ref >=60)
GLUCOSE: 93 mg/dL (ref 65–99)
POTASSIUM: 5.9 mmol/L — AB (ref 3.5–5.3)
SODIUM: 138 mmol/L (ref 135–146)
TOTAL PROTEIN: 7.7 g/dL (ref 6.1–8.1)
Total Bilirubin: 0.4 mg/dL (ref 0.2–1.2)

## 2015-03-10 LAB — CBC WITH DIFFERENTIAL/PLATELET
Basophils Absolute: 0 10*3/uL (ref 0.0–0.1)
Basophils Relative: 0 % (ref 0–1)
EOS ABS: 0.1 10*3/uL (ref 0.0–0.7)
Eosinophils Relative: 2 % (ref 0–5)
HCT: 40.3 % (ref 36.0–46.0)
HEMOGLOBIN: 13.6 g/dL (ref 12.0–15.0)
LYMPHS ABS: 2.7 10*3/uL (ref 0.7–4.0)
Lymphocytes Relative: 39 % (ref 12–46)
MCH: 29.6 pg (ref 26.0–34.0)
MCHC: 33.7 g/dL (ref 30.0–36.0)
MCV: 87.8 fL (ref 78.0–100.0)
MONOS PCT: 8 % (ref 3–12)
MPV: 10.7 fL (ref 8.6–12.4)
Monocytes Absolute: 0.6 10*3/uL (ref 0.1–1.0)
NEUTROS PCT: 51 % (ref 43–77)
Neutro Abs: 3.5 10*3/uL (ref 1.7–7.7)
PLATELETS: 269 10*3/uL (ref 150–400)
RBC: 4.59 MIL/uL (ref 3.87–5.11)
RDW: 13.7 % (ref 11.5–15.5)
WBC: 6.9 10*3/uL (ref 4.0–10.5)

## 2015-03-10 LAB — POCT URINALYSIS DIP (DEVICE)
Bilirubin Urine: NEGATIVE
Glucose, UA: NEGATIVE mg/dL
HGB URINE DIPSTICK: NEGATIVE
KETONES UR: NEGATIVE mg/dL
Leukocytes, UA: NEGATIVE
NITRITE: NEGATIVE
PH: 8.5 — AB (ref 5.0–8.0)
PROTEIN: NEGATIVE mg/dL
Specific Gravity, Urine: 1.015 (ref 1.005–1.030)
UROBILINOGEN UA: 0.2 mg/dL (ref 0.0–1.0)

## 2015-03-10 LAB — TSH: TSH: 2.94 mIU/L

## 2015-03-10 LAB — HEMOGLOBIN A1C
Hgb A1c MFr Bld: 6 % — ABNORMAL HIGH (ref <5.7)
Mean Plasma Glucose: 126 mg/dL — ABNORMAL HIGH (ref <117)

## 2015-03-10 NOTE — Progress Notes (Signed)
Subjective:    Patient ID: Martha Mejia, female    DOB: August 31, 1960, 55 y.o.   MRN: [id number]  HPI Martha Mejia, a 55 year old female presents accompanied by son to establish care. She states that she has not had a primary provider since relocating to area. She has primarily been using urgent care for all primary needs. She also states that she has a gynecologist, Dr. Reynaldo Minium at [facility].  She primarily speaks spanish, so we will use the language line to assist with communication. She reports a history of high cholesterol. She maintains that she was never started on a cholesterol medication, but has been taking over the counter fish oil capsules. She states that she has not been following a low fat diet or exercising. Her major cardiac risk factors include obesity and a sedentary lifestyle. She denies headache, dizziness, chest pain, palpitations, or lower extremity edema.  She is also complaining of adult acne. She states that acne has been present over the past year. She states that acne occurs primarily on the chin and cheeks. She states that she has been using Rubie Maid cleanser and moisturizers to assist with symptoms without satisfactory results.  Past Medical History  Diagnosis Date  . Allergy   . Hyperlipidemia    Past Surgical History  Procedure Laterality Date  . Hernia repair    . Cesarean section    No Known Allergies  Immunization History  Administered Date(s) Administered  . Tdap 12/16/2011   Social History   Social History  . Marital Status: Single    Spouse Name: N/A  . Number of Children: N/A  . Years of Education: N/A   Occupational History  . Not on file.   Social History Main Topics  . Smoking status: Never Smoker   . Smokeless tobacco: Never Used  . Alcohol Use: No  . Drug Use: No  . Sexual Activity: No   Other Topics Concern  . Not on file   Social History Narrative    Review of Systems  Constitutional:  Negative for fever and fatigue.  HENT: Negative.   Eyes: Negative for photophobia and visual disturbance.  Respiratory: Negative.   Cardiovascular: Negative.   Gastrointestinal: Negative.   Endocrine: Negative for cold intolerance, heat intolerance, polydipsia and polyphagia.  Genitourinary: Negative.   Musculoskeletal: Negative.   Skin: Negative.        Acne primarily to face.   Neurological: Negative.  Negative for dizziness, facial asymmetry, numbness and headaches.  Hematological: Negative.   Psychiatric/Behavioral: Negative for suicidal ideas and sleep disturbance.       Objective:   Physical Exam  Constitutional: She is oriented to person, place, and time. She appears well-developed and well-nourished.  HENT:  Head: Normocephalic and atraumatic.  Right Ear: External ear normal.  Left Ear: External ear normal.  Mouth/Throat: Oropharynx is clear and moist.  Eyes: Conjunctivae and EOM are normal. Pupils are equal, round, and reactive to light.  Neck: Normal range of motion. Neck supple.  Abdominal: Soft. Bowel sounds are normal.  Musculoskeletal: Normal range of motion.  Neurological: She is alert and oriented to person, place, and time. She has normal reflexes.  Skin: Skin is warm and dry.  Open and closed comedones primarily to cheeks with mild erythema.    Psychiatric: She has a normal mood and affect. Her behavior is normal. Judgment and thought content normal.         BP 147/84 mmHg  Pulse  71  Temp(Src) 98.5 F (36.9 C) (Oral)  Resp 14  Ht  (1.499 m)  Wt 180 lb (81.647 kg)  BMI 36.34 kg/m2 Assessment & Plan:   1. Obesity (BMI 30-39.9) Recommend a lowfat, low carbohydrate diet divided over 5-6 small meals, increase water intake to 6-8 glasses, and 150 minutes per week of cardiovascular exercise. Patient given written information in Spanish.   - Hemoglobin A1c - COMPLETE METABOLIC PANEL WITH GFR - TSH - CBC with Differential  2. History of  hyperlipidemia  - Lipid Panel; Future  3. Blood pressure elevated Will return in am for a blood pressure check.   4. Acne vulgaris -Wash the face gently at least twice daily with a very mild soap -Wait at least 30 minutes after washing the face before applying topical acne medication in order to minimize the chance of skin irritation.  -Avoid heavy makeup or oily hair conditioners or scalp products -Avoid excessive touching of the face.  - metroNIDAZOLE (METROGEL) 0.75 % gel; Apply 1 application topically 2 (two) times daily.  Dispense: 45 g; Refill: 1  5. Language Barrier to communication Utilized language line to assist with communication  Routine Health Maintenance:   2013 Tetatus vaccination received after dog bite Will send referral for mammogram Repeat pap smear in 2018     Beryl Hornberger M, FNP  RTC: Will follow up in 6 weeks for acne vulgaris   The patient was given clear instructions to go to ER or return to medical center if symptoms do not improve, worsen or new problems develop. The patient verbalized understanding. Will notify patient with laboratory results.

## 2015-03-10 NOTE — Patient Instructions (Signed)
Acn (Acne) El acn es un problema de la piel que causa granos. Se manifiesta cuando los poros de la piel se obstruyen. Los poros pueden infectarse con bacterias o pueden enrojecerse, irritarse o hincharse. El acn es un problema frecuente, especialmente en los adolescentes. Esta afeccin suele desaparecer con el tiempo. CAUSAS Cada poro contiene una glndula sebcea. Las glndulas sebceas producen una sustancia grasa llamada sebo. El acn aparece cuando el sebo, las clulas muertas de la piel y la suciedad obstruyen estas glndulas. Luego, las bacterias que se encuentran normalmente en las glndulas sebceas proliferan y producen inflamacin. Con frecuencia, los cambios hormonales son factores desencadenantes del acn. Estos cambios pueden estimular el agrandamiento de las glndulas sebceas y aumentar la produccin de sebo. Algunos factores que pueden empeorar el acn son los siguientes:  Cambios hormonales durante:  La adolescencia.  Los Exxon Mobil Corporation.  El [place].  Los cosmticos y los productos capilares a base de [place].  El frotado fuerte de la piel.  Los Progress Energy.  El estrs.  Los problemas hormonales causados por ciertas enfermedades.  El cabello largo o graso que roza la piel.  Algunos medicamentos.  La presin que Verizon vinchas, las mochilas o los protectores de hombros.  Exposicin a ciertos aceites y sustancias qumicas. FACTORES DE RIESGO Es ms probable que esta afeccin se manifieste en:  [employer]Ex.  Las personas con antecedentes familiares de acn. SNTOMAS Es ms frecuente que el acn aparezca en el rostro, el cuello, el pecho y la parte superior de la espalda. Algunos sntomas son los siguientes:  Pequeas protuberancias de color rojo (granos o ppulas).  Espinillas blancas.  Espinillas negras.  Granos pequeos llenos de pus (pstulas).  Granos o pstulas grandes de color rojo que causan dolor a la palpacin. Los casos  ms graves de acn pueden producir lo siguiente:  Una zona infectada que contiene una acumulacin de pus (absceso).  Sacos duros, dolorosos y llenos de lquido (quistes).  Cicatrices. DIAGNSTICO Esta afeccin se diagnostica mediante la historia clnica y un examen fsico. Es posible que le indiquen anlisis de [place]. TRATAMIENTO El tratamiento de esta afeccin puede variar en funcin de la gravedad del acn. El tratamiento puede incluir lo siguiente:  Control and instrumentation engineer y lociones que evitan la obstruccin de las glndulas sebceas.  Cremas y lociones que tratan o previenen las infecciones y la inflamacin.  Antibiticos que se aplican en la piel o se toman como comprimidos.  Comprimidos que reducen la produccin de sebo.  Pldoras anticonceptivas.  Fototerapia o tratamientos con lser.  Ciruga.  Inyecciones de medicamentos en las zonas afectadas.  Sustancias qumicas que descaman la piel. El mdico tambin le recomendar la mejor forma de cuidarse la piel. El cuidado adecuado de la piel es el aspecto ms importante del [place]. INSTRUCCIONES PARA EL CUIDADO EN EL HOGAR Cuidado de la piel Cudese la piel como se lo haya indicado el mdico. Tal vez le indiquen que haga lo siguiente:  Lvese la piel con suavidad al menos dos veces por da y adems:  Despus de hacer ejercicios.  Antes de acostarse.  Use un jabn suave.  Aplquese un humectante a base de agua para la piel despus del lavado.  Use una pantalla o un protector solar con factor de proteccin (FPS) de30 o ms. Esto es muy importante si [place] medicamentos para Patent attorney.  Elija cosmticos que no le obstruyan las glndulas sebceas no comedgenos). Medicamentos  Baxter International de venta libre y los recetados solamente como  se lo haya indicado el mdico.  Si le recetaron un antibitico, aplqueselo o tmelo como se lo haya indicado el mdico. No deje de tomar o usar el antibitico aunque la afeccin  mejore. Instrucciones generales  Mantenga el cabello limpio y fuera del rostro. Si tiene el cabello graso, lveselo con champ con frecuencia o CarMax.  No apoye el mentn ni la frente Textron Inc.  No use vinchas ni sombreros ajustados.  No se escarbe ni se apriete los granos, ya que eso puede empeorar el acn y dejar cicatrices.  Concurra a todas las visitas de control como se lo haya indicado el mdico. Esto es importante.  Afitese con suavidad y solo cuando sea necesario.  Lleve un diario de los alimentos para determinar si hay alguno que guarda relacin con el acn. SOLICITE ATENCIN MDICA SI:  El acn no mejora despus de Psychologist, prison and probation services.  El acn [place].  Una zona grande de la piel est enrojecida o sensible.  Cree que el medicamento para tratar el acn tiene efectos secundarios.   Esta informacin no tiene Theme park manager el consejo del mdico. Asegrese de hacerle al mdico cualquier pregunta que tenga.   Document Released: 12/25/2004 Document Revised: 09/15/2014 Elsevier Interactive Patient Education 2016 [employer]. Dieta restringida en grasas y colesterol (Fat and Cholesterol Restricted Diet) El exceso de grasas y colesterol en la dieta puede causar problemas de [place]. Esta dieta lo ayudar a Pharmacologist las grasas y Print production planner en los niveles normales para evitar enfermarse. QU TIPOS DE GRASAS DEBO ELEGIR?  Elija grasas monosaturadas y polinsaturadas. Estas se encuentran en alimentos como el aceite de oliva, aceite de canola, semillas de lino, nueces, almendras y semillas.  Consuma ms grasas omega-3. Las mejores opciones incluyen salmn, caballa, sardinas, atn, aceite de lino y semillas de lino molidas.  Limite el consumo de grasas saturadas, que se encuentran en productos de origen animal, como carnes, mantequilla y crema. Tambin pueden estar en productos vegetales, como aceite de palma, de palmiste y de coco.   Evite los alimentos con  aceites parcialmente hidrogenados. Estos contienen grasas trans. Entre los ejemplos de alimentos con grasas trans se incluyen margarinas en barra, algunas margarinas untables, galletas dulces o saladas y otros productos horneados. QU PAUTAS GENERALES DEBO SEGUIR?   Lea las etiquetas de los alimentos. Busque las palabras "grasas trans" y "grasas saturadas".  Al preparar una comida:  Llene la mitad del plato con verduras y ensaladas de hojas verdes.  Llene un cuarto del plato con cereales integrales. Busque la palabra "integral" en Estate agent de la lista de ingredientes.  Llene un cuarto del plato con alimentos con protenas magras.  Limite las frutas a dos porciones por da. Elija frutas en lugar de jugos.  Coma ms alimentos con fibra soluble, por ejemplo, manzanas, brcoli, zanahorias, frijoles, guisantes y [place]. Trate de consumir de 20a 30g (gramos) de Firefighter.  Coma ms comidas caseras. Coma menos en los restaurantes y los bares.  Limite o evite el alcohol.  Limite los alimentos con alto contenido de almidn y International aid/development worker.  Limite el consumo de alimentos fritos.  Cocine los alimentos sin frerlos. Las opciones de coccin ms [place] son Development worker, community, Regulatory affairs officer, Software engineer y asar a Patent attorney.  Baje de peso si es necesario. Aunque pierda [employer], esto puede ser importante para la salud general. Tambin puede ayudar a prevenir enfermedades como diabetes y enfermedad cardaca. QU ALIMENTOS PUEDO COMER? Cereales Cereales integrales, como los panes  de salvado o integrales, las galletas, los cereales y las pastas. Avena sin endulzar, trigo, [place], quinua o arroz integral. Tortillas de harina de maz o de salvado. Verduras Verduras frescas o congeladas (crudas, al vapor, asadas o grilladas). Ensaladas de hojas verdes. Nils Pyle Nils Pyle frescas, en conserva (en su jugo natural) o frutas congeladas. Carnes y otros productos con protenas Carne de res molida (al 85% o ms [place]),  carne de res de animales alimentados con pastos o carne de res sin la grasa. Pollo o pavo sin piel. Carne de pollo o de [place]. Cerdo sin la grasa. Todos los pescados y frutos de mar. Huevos. Porotos, guisantes o lentejas secos. Frutos secos o semillas sin sal. Frijoles secos o en lata sin sal. Lcteos Productos lcteos con bajo contenido de grasas, como [place] o al 1%, quesos reducidos en grasas o al 2%, ricota con bajo contenido de grasas o [employer], o yogur natural con bajo contenido de [place]. Grasas y Hershey Company untables que no contengan grasas trans. [place] y condimentos para ensaladas livianos o reducidos en grasas. [place]. Aceites de oliva, canola, ssamo o crtamo. Mantequilla natural de cacahuate o almendra (elija la que no tenga agregado de aceite o azcar). Los artculos mencionados arriba pueden no ser Raytheon de las bebidas o los alimentos recomendados. Comunquese con el nutricionista para conocer ms opciones. QU ALIMENTOS NO SE RECOMIENDAN? Cereales Pan blanco. Pastas blancas. Arroz blanco. Pan de maz. Bagels, pasteles y croissants. Galletas saladas que contengan grasas trans. Verduras Papas blancas. MazHoover Brunette con crema o fritas. Verduras en salsa de [place]. Nils Pyle Frutas secas. Fruta enlatada en almbar liviano o espeso. Jugo de frutas. Carnes y otros productos con protenas Cortes de carne con Holiday representative. Costillas, alas de pollo, tocineta, salchicha, mortadela, salame, chinchulines, tocino, perros calientes, salchichas alemanas y embutidos envasados. Hgado y otros rganos. Lcteos [place] entera o al 2%, crema, mezcla de [place] y crema, y queso crema. Quesos enteros. Yogur entero o endulzado. Quesos con toda su grasa. Cremas no lcteas y coberturas batidas. Quesos procesados, quesos para untar o cuajadas. Dulces y postres Jarabe de maz, azcares, miel y Radio broadcast assistant. Caramelos. Mermelada y [place]. Doreen Beam. Cereales endulzados. Galletas,  pasteles, bizcochuelos, donas, muffins y helado. Grasas y [address], [place] en barra, [place] de [place], [place], [place] clarificada o grasa de tocino. Aceites de coco, de palmiste o de palma. Bebidas Alcohol. Bebidas endulzadas (como refrescos, limonadas y bebidas frutales o ponches). Los artculos mencionados arriba pueden no ser Raytheon de las bebidas y los alimentos que se Theatre stage manager. Comunquese con el nutricionista para obtener ms informacin.   Esta informacin no tiene Theme park manager el consejo del mdico. Asegrese de hacerle al mdico cualquier pregunta que tenga.   Document Released: 12/25/2004 Document Revised: 01/15/2014 Elsevier Interactive Patient Education 2016 Arvin[employer]. Hacer ejercicio para Psychiatric nurse (Exercising to [employer]) Hacer ejercicio puede ayudarlo a Publishing copy de peso. Para bajar de Whole Foods ejercicio, este debe ser de intensidad vigorosa. Puede saber que est haciendo ejercicio de intensidad vigorosa si respira con mucha dificultad y rapidez, y no puede mantener una conversacin. El ejercicio de intensidad moderada ayuda a Radio producer peso actual. Puede saber que est haciendo ejercicio de intensidad moderada si tiene una frecuencia cardaca ms elevada y [place] respiracin ms rpida, pero an puede Diplomatic Services operational officer. CON QU FRECUENCIA DEBO HACER EJERCICIO? Elija una actividad que disfrute y establezca objetivos realistas. El mdico puede ayudarlo a Runner, broadcasting/film/video de  actividades que funcione para usted. Haga ejercicio regularmente como se lo haya indicado el mdico. Esta puede incluir:  Programme researcher, broadcasting/film/video de resistencia dos veces por semana, como:  Flexiones de [place].  Abdominales.  Levantamiento de pesas.  Ejercicios con bandas elsticas.  Realizar una intensidad determinada de ejercicio durante una cantidad determinada de [place]. Elija entre estas opciones:  de ejercicio de intensidad  moderada cada semana.  de ejercicio de intensidad vigorosa cada semana.  Burlene Arnt de ejercicio de intensidad moderada y vigorosa cada semana. Los nios, las mujeres [place], las personas que no estn en forma, las personas con sobrepeso y los adultos mayores tal vez tengan que consultar a un mdico para que les d Medical laboratory scientific officer. Si tiene [employer]Illinois, asegrese de Science writer al mdico antes de comenzar un programa de ejercicios nuevo. CULES SON ALGUNAS ACTIVIDADES QUE PUEDEN AYUDARME A BAJAR DE PESO?   Caminar a un ritmo de al menos 4,67millas (7kilmetros) por hora.  Trotar o correr a un ritmo de 5 millas (8 kilmetros) por hora.  Andar en bicicleta a un ritmo de al menos 10 millas (16 kilmetros) por hora.  Practicar natacin.  Practicar patinaje sobre ruedas normales o en lnea.  Hacer esqu de fondo.  Hacer deportes competitivos vigorosos, como ftbol americano, bsquet y ftbol.  Saltar la soga.  Tomar clases de baile aerbico. CMO PUEDO SER MS ACTIVO EN MIS ACTIVIDADES DIARIAS?  Utilice las Microbiologist del ascensor.  D una caminata durante su hora de almuerzo.  Si conduce, estacione el automvil ms lejos del trabajo o de la escuela.  Si [place] transporte pblico, bjese una parada antes y camine el resto del camino.  Pngase de pie y camine cada vez que haga llamadas telefnicas.  Levntese, estrese y camine cada a lo largo del Futures trader. QU PAUTAS DEBO SEGUIR MIENTRAS HAGO EJERCICIO?  No haga ejercicio en exceso que pudiera hacer que se lastime, se sienta mareado o tenga dificultad para respirar.  Consulte al mdico antes de comenzar un programa de ejercicios nuevo.  Use ropa cmoda y calzado con buen soporte.  Beba gran cantidad de agua mientras hace ejercicios para evitar la deshidratacin o los golpes de Airline pilot. Durante la actividad fsica se pierde agua corporal que se debe reponer.  Haga ejercicio hasta  que se acelere su respiracin y sus latidos cardacos.   Esta informacin no tiene Theme park manager el consejo del mdico. Asegrese de hacerle al mdico cualquier pregunta que tenga.   Document Released: 03/31/2010 Document Revised: 01/15/2014 Elsevier Interactive Patient Education [employer]

## 2015-03-11 ENCOUNTER — Ambulatory Visit: Payer: No Typology Code available for payment source

## 2015-03-11 ENCOUNTER — Other Ambulatory Visit (INDEPENDENT_AMBULATORY_CARE_PROVIDER_SITE_OTHER): Payer: No Typology Code available for payment source

## 2015-03-11 ENCOUNTER — Encounter: Payer: Self-pay | Admitting: Family Medicine

## 2015-03-11 ENCOUNTER — Other Ambulatory Visit: Payer: Self-pay | Admitting: Family Medicine

## 2015-03-11 DIAGNOSIS — E875 Hyperkalemia: Secondary | ICD-10-CM

## 2015-03-11 DIAGNOSIS — L700 Acne vulgaris: Secondary | ICD-10-CM | POA: Insufficient documentation

## 2015-03-11 DIAGNOSIS — Z8639 Personal history of other endocrine, nutritional and metabolic disease: Secondary | ICD-10-CM

## 2015-03-11 DIAGNOSIS — R030 Elevated blood-pressure reading, without diagnosis of hypertension: Secondary | ICD-10-CM

## 2015-03-11 DIAGNOSIS — Z789 Other specified health status: Secondary | ICD-10-CM | POA: Insufficient documentation

## 2015-03-11 LAB — POTASSIUM: POTASSIUM: 4.2 mmol/L (ref 3.5–5.3)

## 2015-03-11 LAB — LIPID PANEL
CHOL/HDL RATIO: 4.3 ratio (ref <=5.0)
Cholesterol: 230 mg/dL — ABNORMAL HIGH (ref 125–200)
HDL: 53 mg/dL (ref >=46)
LDL Cholesterol: 146 mg/dL — ABNORMAL HIGH (ref <130)
Triglycerides: 153 mg/dL — ABNORMAL HIGH (ref <150)
VLDL: 31 mg/dL — AB (ref <30)

## 2015-03-11 MED ORDER — METRONIDAZOLE 0.75 % EX GEL: 1.0000 "application " | Freq: Two times a day (BID) | CUTANEOUS | Status: DC

## 2015-03-14 ENCOUNTER — Other Ambulatory Visit: Payer: Self-pay | Admitting: Family Medicine

## 2015-03-14 DIAGNOSIS — E785 Hyperlipidemia, unspecified: Secondary | ICD-10-CM

## 2015-03-14 MED ORDER — ASPIRIN EC 81 MG PO TBEC: 81.0000 mg | DELAYED_RELEASE_TABLET | Freq: Every day | ORAL | Status: AC

## 2015-03-14 MED ORDER — ATORVASTATIN CALCIUM 20 MG PO TABS: 20.0000 mg | ORAL_TABLET | Freq: Every day | ORAL | Status: DC

## 2015-03-14 NOTE — Progress Notes (Signed)
Called patient using Language line Interpreter Jake SharkHarold ID [id number] advised of labs and need to start atorvastatin 20mg  daily and Asprin 81mg  daily. Advised patient to follow diet as discussed during office visit and 3 month follow up was scheduled for 06/15/2015. Patient denied any other questions at this time. Thanks!

## 2015-03-14 NOTE — Progress Notes (Signed)
Meds ordered this encounter  Medications  . atorvastatin (LIPITOR) 20 MG tablet    Sig: Take 1 tablet (20 mg total) by mouth daily.    Dispense:  90 tablet    Refill:  1  . aspirin EC 81 MG tablet    Sig: Take 1 tablet (81 mg total) by mouth daily.    Dispense:  30 tablet    Refill:  5    Francina Beery M, FNP

## 2015-03-21 ENCOUNTER — Other Ambulatory Visit: Payer: Self-pay | Admitting: Family Medicine

## 2015-03-21 DIAGNOSIS — Z1231 Encounter for screening mammogram for malignant neoplasm of breast: Secondary | ICD-10-CM

## 2015-03-22 ENCOUNTER — Ambulatory Visit: Payer: No Typology Code available for payment source

## 2015-03-31 ENCOUNTER — Ambulatory Visit
Admission: RE | Admit: 2015-03-31 | Discharge: 2015-03-31 | Disposition: A | Discharge status: Home / Self Care | Payer: No Typology Code available for payment source | Source: Ambulatory Visit | Attending: Family Medicine | Admitting: Family Medicine

## 2015-03-31 DIAGNOSIS — Z1231 Encounter for screening mammogram for malignant neoplasm of breast: Secondary | ICD-10-CM

## 2015-04-01 ENCOUNTER — Other Ambulatory Visit: Payer: Self-pay | Admitting: Family Medicine

## 2015-04-01 DIAGNOSIS — R928 Other abnormal and inconclusive findings on diagnostic imaging of breast: Secondary | ICD-10-CM

## 2015-04-07 ENCOUNTER — Other Ambulatory Visit: Payer: Self-pay | Admitting: Family Medicine

## 2015-04-07 ENCOUNTER — Ambulatory Visit
Admission: RE | Admit: 2015-04-07 | Discharge: 2015-04-07 | Disposition: A | Discharge status: Home / Self Care | Payer: No Typology Code available for payment source | Source: Ambulatory Visit | Attending: Family Medicine | Admitting: Family Medicine

## 2015-04-07 DIAGNOSIS — R2231 Localized swelling, mass and lump, right upper limb: Secondary | ICD-10-CM

## 2015-04-07 DIAGNOSIS — R928 Other abnormal and inconclusive findings on diagnostic imaging of breast: Secondary | ICD-10-CM

## 2015-04-12 ENCOUNTER — Ambulatory Visit
Admission: RE | Admit: 2015-04-12 | Discharge: 2015-04-12 | Disposition: A | Discharge status: Home / Self Care | Payer: No Typology Code available for payment source | Source: Ambulatory Visit | Attending: Family Medicine | Admitting: Family Medicine

## 2015-04-12 DIAGNOSIS — R2231 Localized swelling, mass and lump, right upper limb: Secondary | ICD-10-CM

## 2015-04-15 ENCOUNTER — Other Ambulatory Visit: Payer: No Typology Code available for payment source

## 2015-04-29 ENCOUNTER — Ambulatory Visit: Payer: No Typology Code available for payment source | Admitting: Podiatry

## 2015-04-29 ENCOUNTER — Encounter: Payer: Self-pay | Admitting: Podiatry

## 2015-04-29 VITALS — BP 172/91 | HR 79 | Resp 18

## 2015-04-29 DIAGNOSIS — Q828 Other specified congenital malformations of skin: Secondary | ICD-10-CM

## 2015-05-01 DIAGNOSIS — Q828 Other specified congenital malformations of skin: Secondary | ICD-10-CM | POA: Insufficient documentation

## 2015-05-01 NOTE — Progress Notes (Signed)
Patient ID: Martha Mejia Maria Thome, female   DOB: 05/02/1960, 55 y.o.   MRN: [id number]  Subjective: Patient presents for callus in the bottom of her right foot. The lesion that she'll left the last upon it has resolved. Lesion the right foot is painful mostly with pressure in shoe gear. No recent injury or trauma. No stepdown foreign objects. No swelling or redness. No tingling or numbness. Denies any systemic complaints such as fevers, chills, nausea, vomiting. No acute changes since last appointment, and no other complaints at this time.   Objective: AAO x3, NAD DP/PT pulses palpable bilaterally, CRT less than 3 seconds Protective sensation intact with Dorann OuSimms Weinstein monofilament Right foot submetatarsal 2 hyperkeratotic lesion. Upon debridement as punctate annular hyperkeratotic lesion likely a porokeratosis. No drainage or pus. No underlying foreign body. No Ulceration. No areas of pinpoint bony tenderness or pain with vibratory sensation. MMT 5/5, ROM WNL. No edema, erythema, increase in warmth to bilateral lower extremities.  No open lesions or pre-ulcerative lesions.  No pain with calf compression, swelling, warmth, erythema  Assessment: Right second metatarsal two porokeratosis.  Plan: -All treatment options discussed with the patient including all alternatives, risks, complications.  -Lesion was debrided without complications or bleeding. The area was cleaned followed by a doughnut pad and salicylic acid. Post procedure instructions were discussed. Monitor percent or symptoms of infection. Follow-up if symptoms continue or return. -Patient encouraged to call the office with any questions, concerns, change in symptoms.   Ovid CurdMatthew Wagoner, DPM

## 2015-06-15 ENCOUNTER — Ambulatory Visit: Payer: No Typology Code available for payment source | Admitting: Family Medicine

## 2015-07-13 ENCOUNTER — Ambulatory Visit: Payer: No Typology Code available for payment source | Admitting: Family Medicine

## 2015-08-23 ENCOUNTER — Ambulatory Visit: Payer: No Typology Code available for payment source | Admitting: Family Medicine

## 2015-09-08 ENCOUNTER — Other Ambulatory Visit: Payer: Self-pay | Admitting: Family Medicine

## 2015-09-13 ENCOUNTER — Ambulatory Visit: Payer: No Typology Code available for payment source | Admitting: Family Medicine

## 2015-10-03 ENCOUNTER — Other Ambulatory Visit (HOSPITAL_COMMUNITY): Payer: Self-pay | Admitting: *Deleted

## 2015-10-03 DIAGNOSIS — N6310 Unspecified lump in the right breast, unspecified quadrant: Secondary | ICD-10-CM

## 2015-10-13 ENCOUNTER — Ambulatory Visit (HOSPITAL_COMMUNITY)
Admission: RE | Admit: 2015-10-13 | Discharge: 2015-10-13 | Disposition: A | Discharge status: Home / Self Care | Payer: Self-pay | Source: Ambulatory Visit | Attending: Obstetrics and Gynecology | Admitting: Obstetrics and Gynecology

## 2015-10-13 ENCOUNTER — Ambulatory Visit (HOSPITAL_COMMUNITY)
Admission: EM | Admit: 2015-10-13 | Discharge: 2015-10-13 | Disposition: A | Discharge status: Home / Self Care | Payer: No Typology Code available for payment source | Attending: Internal Medicine | Admitting: Internal Medicine

## 2015-10-13 ENCOUNTER — Ambulatory Visit
Admission: RE | Admit: 2015-10-13 | Discharge: 2015-10-13 | Disposition: A | Discharge status: Home / Self Care | Payer: No Typology Code available for payment source | Source: Ambulatory Visit | Attending: Obstetrics and Gynecology | Admitting: Obstetrics and Gynecology

## 2015-10-13 ENCOUNTER — Encounter (HOSPITAL_COMMUNITY): Payer: Self-pay

## 2015-10-13 ENCOUNTER — Encounter (HOSPITAL_COMMUNITY): Payer: Self-pay | Admitting: Emergency Medicine

## 2015-10-13 VITALS — BP 188/88 | Temp 98.5°F | Ht 59.5 in | Wt 169.0 lb

## 2015-10-13 DIAGNOSIS — N6310 Unspecified lump in the right breast, unspecified quadrant: Secondary | ICD-10-CM

## 2015-10-13 DIAGNOSIS — R030 Elevated blood-pressure reading, without diagnosis of hypertension: Secondary | ICD-10-CM

## 2015-10-13 DIAGNOSIS — Z1239 Encounter for other screening for malignant neoplasm of breast: Secondary | ICD-10-CM

## 2015-10-13 NOTE — ED Triage Notes (Signed)
[facility] for elevated BP .... Reading at their office was 190/92 and was retaken at 188/88  BP now is 179/81  Pt is asymptomatic at them moment but does recall she felt dizzy yest and felt/heard a noise in bilateral ears  Also concerned about DM as she was told she was prediabetic  A&O x4.... NAD

## 2015-10-13 NOTE — Progress Notes (Signed)
No complaints today. [facility] due to recommending a 6 month right breast diagnostic mammogram and possible ultrasound. Right breast biopsy was completed 04/12/2015, right breast ultrasound completed 04/07/2015, and bilateral screening mammogram completed 03/31/2015.  Pap Smear:  Pap smear not completed today. Last Pap smear was 08/25/2013 at Dr. Fontaine NoFernandez's office and normal with negative HPV. Per patient has no history of an abnormal Pap smear. Last Pap smear result is in EPIC.  Physical exam: Breasts Breasts symmetrical. No skin abnormalities bilateral breasts. No nipple retraction bilateral breasts. No nipple discharge bilateral breasts. No lymphadenopathy. No lumps palpated bilateral breasts. Complaints of right breast tenderness at 6 o'clock on exam. [facility] right breast diagnostic mammogram and possible ultrasound per recommendation. Appointment scheduled for Thursday, October 13, 2015 at 0910.        Pelvic/Bimanual No Pap smear completed today since last Pap smear and HPV typing was 08/25/2013. Pap smear not indicated per BCCCP guidelines.   Smoking History: Patient has never smoked.  Patient Navigation: Patient education provided. Access to services [facility] for [facility]. Spanish interpreter provided.   Colorectal Cancer Screening: Per patient has never had a colonoscopy. No complaints today.  Used Spanish interpreter [employer] [facility] [place].

## 2015-10-13 NOTE — Patient Instructions (Signed)
Explained breast self awareness with Martha Mejia. Patient did not need a Pap smear today due to last Pap smear and HPV typing was 08/25/2013. Let her know B[facility]CP will cover Pap smears and HPV typing every 5 years unless has a history of abnormal Pap smears. [facility] right breast diagnostic mammogram and possible ultrasound per recommendation. Appointment scheduled for Thursday, October 13, 2015 at 0910. Discussed with patient her high blood pressure and importance in following up with a physician today. Patient's son is going to take her to the Urgent Care after [facility] appointment and notify her PCP. Martha Mejia verbalized understanding.  Tahira Olivarez, Kathaleen Maserhristine Poll, RN 9:35 AM

## 2015-10-13 NOTE — ED Provider Notes (Signed)
[facility]    CSN: [id number] Arrival date & time: 10/13/15  1017     History   Chief Complaint Chief Complaint  Patient presents with  . Hypertension  . Diabetes    HPI Martha Mejia is a 55 y.o. female. She presents today referred by the breast Center, where she was noted to have an elevated blood pressure. No chest pain, no dyspnea, no headache. Yesterday, she had a single brief episode, 5 seconds, of lightheadedness/feeling off balance, that has resolved. Has had an elevated blood pressure observed on occasion before, but no prior history of treated blood pressure. Not taking cold medicines or energy supplements, but does drink coffee.  HPI  Past Medical History:  Diagnosis Date  . Allergy   . Hyperlipidemia     Patient Active Problem List   Diagnosis Date Noted  . Porokeratosis 05/01/2015  . Hyperlipidemia LDL goal <100 03/14/2015  . Language barrier to communication 03/11/2015  . Acne vulgaris 03/11/2015  . Elevated blood sugar 08/25/2013  . History of hyperlipidemia 08/25/2013  . Elevated liver function tests 08/25/2013    Past Surgical History:  Procedure Laterality Date  . CESAREAN SECTION    . HERNIA REPAIR      OB History    Gravida Para Term Preterm AB Living   3 1     2 1    SAB TAB Ectopic Multiple Live Births     2             Home Medications    Prior to Admission medications   Medication Sig Start Date End Date Taking? Authorizing Provider  aspirin EC 81 MG tablet Take 1 tablet (81 mg total) by mouth daily. 03/14/15  Yes Massie Maroon, FNP  Multiple Vitamin (MULTIVITAMIN) capsule Take 1 capsule by mouth daily. Reported on 03/10/2015   Yes Historical Provider, MD  Omega-3 Fatty Acids (FISH OIL) 1200 MG CPDR Take by mouth.   Yes Historical Provider, MD  Ascorbic Acid (VITAMIN C) 1000 MG tablet Take 1,000 mg by mouth daily.    Historical Provider, MD  atorvastatin (LIPITOR) 20 MG tablet Take 1 tablet (20 mg total) by mouth  daily. Patient not taking: Reported on 10/13/2015 03/14/15   Massie Maroon, FNP  Biotin 5000 MCG TABS Take by mouth.    Historical Provider, MD  metroNIDAZOLE (METROGEL) 0.75 % gel Apply 1 application topically 2 (two) times daily. Patient not taking: Reported on 10/13/2015 03/11/15   Massie Maroon, FNP  Multiple Vitamins-Minerals (OCUVITE ADULT 50+ PO) Take by mouth.    Historical Provider, MD  Vitamin D, Cholecalciferol, 1000 units TABS Take by mouth.    Historical Provider, MD    Family History Family History  Problem Relation Age of Onset  . Adopted: Yes    Social History Social History  Substance Use Topics  . Smoking status: Never Smoker  . Smokeless tobacco: Never Used  . Alcohol use No     Allergies   Review of patient's allergies indicates no known allergies.   Review of Systems Review of Systems  All other systems reviewed and are negative.    Physical Exam Triage Vital Signs ED Triage Vitals [10/13/15 1115]  Enc Vitals Group     BP 179/81     Pulse Rate 72     Resp 18     Temp 98.2 F (36.8 C)     Temp Source Oral     SpO2 99 %  Weight      Height    Updated Vital Signs BP 149/69 (BP Location: Right Arm)   Pulse 72   Temp 98.2 F (36.8 C) (Oral)   Resp 18   SpO2 99%  Physical Exam  Constitutional: She is oriented to person, place, and time. No distress.  Alert, nicely groomed  HENT:  Head: Atraumatic.  Eyes:  Conjugate gaze, no eye redness/drainage  Neck: Neck supple.  Cardiovascular: Normal rate and regular rhythm.   Pulmonary/Chest: No respiratory distress.  Lungs clear, symmetric breath sounds  Abdominal: She exhibits no distension.  Musculoskeletal: Normal range of motion.  No leg swelling  Neurological: She is alert and oriented to person, place, and time.  Skin: Skin is warm and dry.  No cyanosis  Nursing note and vitals reviewed.    UC Treatments / Results    Procedures Procedures (including critical care time)      BP rechecked with a large cuff   Final Clinical Impressions(s) / UC Diagnoses   Final diagnoses:  Elevated blood-pressure reading without diagnosis of hypertension   Many possible causes of blood pressure elevation but no consistent pattern of blood pressure being elevated at this time.   No symptoms (chest discomfort, trouble breathing, bad headache) to suggest immediate threat from blood pressure. Blood pressure rechecked at urgent care with large cuff was 149/69.   Followup with primary care provider in about a week to recheck blood pressure and discuss further.      Eustace MooreLaura W Bryah Ocheltree, MD 10/20/15 [phone]

## 2015-10-13 NOTE — Discharge Instructions (Addendum)
Many possible causes of blood pressure elevation but no consistent pattern of blood pressure being elevated at this time.   No symptoms (chest discomfort, trouble breathing, bad headache) to suggest immediate threat from blood pressure. Blood pressure rechecked at urgent care with large cuff was 149/69.   Followup with primary care provider in about a week to recheck blood pressure and discuss further.

## 2015-10-13 NOTE — ED Notes (Signed)
Pt d/c by Dr. Murray 

## 2015-10-17 ENCOUNTER — Encounter (HOSPITAL_COMMUNITY): Payer: Self-pay | Admitting: *Deleted

## 2015-11-07 ENCOUNTER — Ambulatory Visit: Payer: Self-pay | Admitting: Podiatry

## 2015-11-18 ENCOUNTER — Ambulatory Visit: Payer: No Typology Code available for payment source | Attending: Internal Medicine

## 2015-11-18 DIAGNOSIS — R030 Elevated blood-pressure reading, without diagnosis of hypertension: Secondary | ICD-10-CM | POA: Insufficient documentation

## 2015-11-25 ENCOUNTER — Ambulatory Visit (INDEPENDENT_AMBULATORY_CARE_PROVIDER_SITE_OTHER): Payer: No Typology Code available for payment source | Admitting: Family Medicine

## 2015-11-25 ENCOUNTER — Ambulatory Visit: Payer: No Typology Code available for payment source | Admitting: Family Medicine

## 2015-11-25 VITALS — BP 136/87 | HR 83 | Temp 98.7°F | Resp 18 | Ht 59.0 in | Wt 180.0 lb

## 2015-11-25 DIAGNOSIS — Z0000 Encounter for general adult medical examination without abnormal findings: Secondary | ICD-10-CM

## 2015-11-25 DIAGNOSIS — M8580 Other specified disorders of bone density and structure, unspecified site: Secondary | ICD-10-CM

## 2015-11-25 LAB — LIPID PANEL
CHOLESTEROL: 234 mg/dL — AB (ref <200)
HDL: 65 mg/dL (ref >50)
LDL Cholesterol: 142 mg/dL — ABNORMAL HIGH (ref <100)
TRIGLYCERIDES: 133 mg/dL (ref <150)
Total CHOL/HDL Ratio: 3.6 Ratio (ref <5.0)
VLDL: 27 mg/dL (ref <30)

## 2015-11-25 LAB — CBC WITH DIFFERENTIAL/PLATELET
BASOS ABS: 0 {cells}/uL (ref 0–200)
Basophils Relative: 0 %
EOS PCT: 2 %
Eosinophils Absolute: 104 cells/uL (ref 15–500)
HCT: 41.3 % (ref 35.0–45.0)
HEMOGLOBIN: 13.4 g/dL (ref 11.7–15.5)
LYMPHS ABS: 2184 {cells}/uL (ref 850–3900)
Lymphocytes Relative: 42 %
MCH: 29.3 pg (ref 27.0–33.0)
MCHC: 32.4 g/dL (ref 32.0–36.0)
MCV: 90.2 fL (ref 80.0–100.0)
MONOS PCT: 7 %
MPV: 10.7 fL (ref 7.5–12.5)
Monocytes Absolute: 364 cells/uL (ref 200–950)
NEUTROS ABS: 2548 {cells}/uL (ref 1500–7800)
NEUTROS PCT: 49 %
PLATELETS: 253 10*3/uL (ref 140–400)
RBC: 4.58 MIL/uL (ref 3.80–5.10)
RDW: 14.1 % (ref 11.0–15.0)
WBC: 5.2 10*3/uL (ref 3.8–10.8)

## 2015-11-25 LAB — COMPLETE METABOLIC PANEL WITH GFR
ALBUMIN: 4.2 g/dL (ref 3.6–5.1)
ALK PHOS: 68 U/L (ref 33–130)
ALT: 17 U/L (ref 6–29)
AST: 20 U/L (ref 10–35)
BILIRUBIN TOTAL: 0.5 mg/dL (ref 0.2–1.2)
BUN: 17 mg/dL (ref 7–25)
CALCIUM: 9.5 mg/dL (ref 8.6–10.4)
CO2: 26 mmol/L (ref 20–31)
Chloride: 104 mmol/L (ref 98–110)
Creat: 0.64 mg/dL (ref 0.50–1.05)
GFR, Est African American: >89 mL/min (ref >=60)
Glucose, Bld: 94 mg/dL (ref 65–99)
POTASSIUM: 4.4 mmol/L (ref 3.5–5.3)
Sodium: 139 mmol/L (ref 135–146)
TOTAL PROTEIN: 7.2 g/dL (ref 6.1–8.1)

## 2015-11-25 LAB — TSH: TSH: 2.84 m[IU]/L

## 2015-11-25 LAB — HEMOGLOBIN A1C
Hgb A1c MFr Bld: 5.8 % — ABNORMAL HIGH (ref <5.7)
Mean Plasma Glucose: 120 mg/dL

## 2015-11-25 MED ORDER — NAPROXEN 500 MG PO TABS: 500.0000 mg | ORAL_TABLET | Freq: Two times a day (BID) | ORAL | 1 refills | Status: AC

## 2015-11-25 NOTE — Progress Notes (Signed)
Patient is here for FU  Patient complains of right shoulder and arm pain being present for 8 months.  Patient has taken medication today. Patient had black coffee this morning.

## 2015-11-25 NOTE — Progress Notes (Signed)
Martha Mejia, is a 55 y.o. female  WUJ:[id number]:[id number]  NFA:[id number]  DOB - 03/08/1960  CC:  Chief Complaint  Patient presents with  . Follow-up       HPI: Martha BlewReyna Ashenfelter is a 55 y.o. female here  for routine follow-up. She has a history of hypercholesterolemia and is on atorvostatin. She take ascorbic acid, biotin, mvis, omega-3 fatty acids, and Vitamin d. She is requesting a bone density study as she has a history of osteopenia. She only voices complaint today is of right shoulder and arm pain. She reports aching and tenderness.   Health Maintenance: She declines flu shot today. She needs screening for HIV and Hep C. She also needs colon cancer screening. She reports a normal PAP in 2015.  No Known Allergies Past Medical History:  Diagnosis Date  . Allergy   . Hyperlipidemia    Current Outpatient Prescriptions on File Prior to Visit  Medication Sig Dispense Refill  . Ascorbic Acid (VITAMIN C) 1000 MG tablet Take 1,000 mg by mouth daily.    Marland Kitchen. aspirin EC 81 MG tablet Take 1 tablet (81 mg total) by mouth daily. 30 tablet 5  . Biotin 5000 MCG TABS Take by mouth.    . Multiple Vitamin (MULTIVITAMIN) capsule Take 1 capsule by mouth daily. Reported on 03/10/2015    . Multiple Vitamins-Minerals (OCUVITE ADULT 50+ PO) Take by mouth.    . Omega-3 Fatty Acids (FISH OIL) 1200 MG CPDR Take by mouth.    . Vitamin D, Cholecalciferol, 1000 units TABS Take by mouth.    Marland Kitchen. atorvastatin (LIPITOR) 20 MG tablet Take 1 tablet (20 mg total) by mouth daily. (Patient not taking: Reported on 11/25/2015) 90 tablet 1   No current facility-administered medications on file prior to visit.    Family History  Problem Relation Age of Onset  . Adopted: Yes   Social History   Social History  . Marital status: Single    Spouse name: N/A  . Number of children: N/A  . Years of education: N/A   Occupational History  . Not on file.   Social History Main Topics  . Smoking status: Never Smoker  . Smokeless  tobacco: Never Used  . Alcohol use No  . Drug use: No  . Sexual activity: No   Other Topics Concern  . Not on file   Social History Narrative  . No narrative on file    Review of Systems: Constitutional: Negative Skin: Negative HENT: Negative  Eyes: + blurry vision Neck: Negative Respiratory: Negative Cardiovascular: Negative Gastrointestinal: Negative Genitourinary: Negative  Musculoskeletal: Right shoulder and arm pain,history of osteopenia. Aching of legs and feet. Neurological: Negative for Hematological: Negative  Psychiatric/Behavioral: Negative    Objective:   Vitals:   11/25/15 0930  BP: 136/87  Pulse: 83  Resp: 18  Temp: 98.7 F (37.1 C)    Physical Exam: Constitutional: Patient appears well-developed and well-nourished. No distress. HENT: Normocephalic, atraumatic, External right and left ear normal. Oropharynx is clear and moist.  Eyes: Conjunctivae and EOM are normal. PERRLA, no scleral icterus. Neck: Normal ROM. Neck supple. No lymphadenopathy, No thyromegaly. CVS: RRR, S1/S2 +, no murmurs, no gallops, no rubs Pulmonary: Effort and breath sounds normal, no stridor, rhonchi, wheezes, rales.  Abdominal: Soft. Normoactive BS,, no distension, tenderness, rebound or guarding.  Musculoskeletal: Normal range of motion. No edema and no tenderness.  Neuro: Alert.Normal muscle tone coordination. Non-focal Skin: Skin is warm and dry. No rash noted. Not diaphoretic. No erythema. No  pallor. Psychiatric: Normal mood and affect. Behavior, judgment, thought content normal.  Lab Results  Component Value Date   WBC 6.9 03/10/2015   HGB 13.6 03/10/2015   HCT 40.3 03/10/2015   MCV 87.8 03/10/2015   PLT 269 03/10/2015   Lab Results  Component Value Date   CREATININE 0.47 (L) 03/10/2015   BUN 21 03/10/2015   NA 138 03/10/2015   K 4.2 03/11/2015   CL 106 03/10/2015   CO2 21 03/10/2015    Lab Results  Component Value Date   HGBA1C 6.0 (H) 03/10/2015    Lipid Panel     Component Value Date/Time   CHOL 230 (H) 03/11/2015 0832   TRIG 153 (H) 03/11/2015 0832   HDL 53 03/11/2015 0832   CHOLHDL 4.3 03/11/2015 0832   VLDL 31 (H) 03/11/2015 0832   LDLCALC 146 (H) 03/11/2015 0832        Assessment and plan:   1. Healthcare maintenance  - Hemoglobin A1c - COMPLETE METABOLIC PANEL WITH GFR - CBC with Differential - Lipid panel - HIV antibody (with reflex) - Hepatitis C Antibody - TSH  2. Osteopenia, unspecified location  - Vitamin D 1,25 dihydroxy   No Follow-up on file.  The patient was given clear instructions to go to ER or return to medical center if symptoms don't improve, worsen or new problems develop. The patient verbalized understanding.    Henrietta HooverLinda C Bria Portales FNP  11/25/2015, 12:03 PM

## 2015-11-25 NOTE — Patient Instructions (Signed)
Take naproxen regularly for 2 weeks and then as needed.

## 2015-11-26 LAB — HIV ANTIBODY (ROUTINE TESTING W REFLEX): HIV 1&2 Ab, 4th Generation: NONREACTIVE

## 2015-11-26 LAB — HEPATITIS C ANTIBODY: HCV AB: NEGATIVE

## 2015-11-28 LAB — VITAMIN D 1,25 DIHYDROXY
VITAMIN D 1, 25 (OH) TOTAL: 47 pg/mL (ref 18–72)
VITAMIN D3 1, 25 (OH): 47 pg/mL

## 2016-01-19 ENCOUNTER — Encounter: Payer: Self-pay | Admitting: Podiatry

## 2016-01-19 ENCOUNTER — Ambulatory Visit: Payer: Self-pay | Admitting: Podiatry

## 2016-01-19 VITALS — BP 174/96

## 2016-01-19 DIAGNOSIS — Q828 Other specified congenital malformations of skin: Secondary | ICD-10-CM

## 2016-01-19 NOTE — Progress Notes (Signed)
Patient ID: Martha Mejia Maria Rowlands, female   DOB: 09/04/1960, 56 y.o.   MRN: [id number]  Subjective: Ms. Donnamarie Rossettiineda presents for callus in the bottom of her right foot. She states that it went away but it has come back and is painful with shoes and pressure. Denies any swelling or redness or drainage. No recent treatment. Denies any systemic complaints such as fevers, chills, nausea, vomiting. No acute changes since last appointment, and no other complaints at this time.   Objective: AAO x3, NAD DP/PT pulses palpable bilaterally, CRT less than 3 seconds Protective sensation intact with Dorann OuSimms Weinstein monofilament Right foot submetatarsal 2 hyperkeratotic lesion. Upon debridement as punctate annular hyperkeratotic lesion likely a porokeratosis. No drainage or pus. No underlying foreign body. No underlying ulceration.  No areas of tenderness bilaterally.  No edema, erythema, increase in warmth to bilateral lower extremities.  No open lesions or pre-ulcerative lesions.  No pain with calf compression, swelling, warmth, erythema  Assessment: Right second submetatarsal two porokeratosis  Plan: -All treatment options discussed with the patient including all alternatives, risks, complications.  -Lesion was debrided without complications or bleeding. The area was cleaned followed by a pad and salicylic acid. Post procedure instructions were discussed. Monitor percent or symptoms of infection. Follow-up if symptoms continue or return. -Patient encouraged to call the office with any questions, concerns, change in symptoms.   Ovid CurdMatthew Elianah Karis, DPM

## 2016-01-31 ENCOUNTER — Telehealth: Payer: Self-pay

## 2016-02-01 NOTE — Telephone Encounter (Signed)
Bonita QuinLinda, Can you please order bone density test for patient if needed. Thanks!

## 2016-02-06 NOTE — Telephone Encounter (Signed)
She has an appointment with Armeniahina in early Feb. Can discuss with her.

## 2016-02-13 ENCOUNTER — Encounter: Payer: Self-pay | Admitting: Family Medicine

## 2016-02-13 ENCOUNTER — Ambulatory Visit (HOSPITAL_COMMUNITY)
Admission: RE | Admit: 2016-02-13 | Discharge: 2016-02-13 | Disposition: A | Discharge status: Home / Self Care | Payer: No Typology Code available for payment source | Source: Ambulatory Visit | Attending: Family Medicine | Admitting: Family Medicine

## 2016-02-13 ENCOUNTER — Ambulatory Visit (INDEPENDENT_AMBULATORY_CARE_PROVIDER_SITE_OTHER): Payer: No Typology Code available for payment source | Admitting: Family Medicine

## 2016-02-13 VITALS — BP 156/78 | HR 69 | Temp 98.6°F | Resp 18 | Wt 171.4 lb

## 2016-02-13 DIAGNOSIS — R7303 Prediabetes: Secondary | ICD-10-CM

## 2016-02-13 DIAGNOSIS — Z8739 Personal history of other diseases of the musculoskeletal system and connective tissue: Secondary | ICD-10-CM

## 2016-02-13 DIAGNOSIS — M79601 Pain in right arm: Secondary | ICD-10-CM

## 2016-02-13 DIAGNOSIS — R030 Elevated blood-pressure reading, without diagnosis of hypertension: Secondary | ICD-10-CM

## 2016-02-13 DIAGNOSIS — Z1211 Encounter for screening for malignant neoplasm of colon: Secondary | ICD-10-CM

## 2016-02-13 DIAGNOSIS — E785 Hyperlipidemia, unspecified: Secondary | ICD-10-CM

## 2016-02-13 LAB — LIPID PANEL
CHOLESTEROL: 225 mg/dL — AB (ref <200)
HDL: 64 mg/dL (ref >50)
LDL CALC: 133 mg/dL — AB (ref <100)
Total CHOL/HDL Ratio: 3.5 Ratio (ref <5.0)
Triglycerides: 140 mg/dL (ref <150)
VLDL: 28 mg/dL (ref <30)

## 2016-02-13 LAB — POCT GLYCOSYLATED HEMOGLOBIN (HGB A1C): Hemoglobin A1C: 5.6

## 2016-02-13 MED ORDER — KETOROLAC TROMETHAMINE 60 MG/2ML IM SOLN
60.0000 mg | Freq: Once | INTRAMUSCULAR | Status: AC
  Administered 2016-02-13: 60 mg via INTRAMUSCULAR

## 2016-02-13 NOTE — Patient Instructions (Signed)
Hyperglycemia Hyperglycemia is when the sugar (glucose) level in your blood is too high. It may not cause symptoms. If you do have symptoms, they may include warning signs, such as:  Feeling more thirsty than normal.  Hunger.  Feeling tired.  Needing to pee (urinate) more than normal.  Blurry eyesight (vision). You may get other symptoms as it gets worse, such as:  Dry mouth.  Not being hungry (loss of appetite).  Fruity-smelling breath.  Weakness.  Weight gain or loss that is not planned. Weight loss may be fast.  A tingling or numb feeling in your hands or feet.  Headache.  Skin that does not bounce back quickly when it is lightly pinched and released (poor skin turgor).  Pain in your belly (abdomen).  Cuts or bruises that heal slowly. High blood sugar can happen to people who do or do not have diabetes. High blood sugar can happen slowly or quickly, and it can be an emergency. Follow these instructions at home: General instructions  Take over-the-counter and prescription medicines only as told by your doctor.  Do not use products that contain nicotine or tobacco, such as cigarettes and e-cigarettes. If you need help quitting, ask your doctor.  Limit alcohol intake to no more than 1 drink per day for nonpregnant women and 2 drinks per day for men. One drink equals 12 oz of beer, 5 oz of wine, or 1 oz of hard liquor.  Manage stress. If you need help with this, ask your doctor.  Keep all follow-up visits as told by your doctor. This is important. Eating and drinking  Stay at a healthy weight.  Exercise regularly, as told by your doctor.  Drink enough fluid, especially when you:  Exercise.  Get sick.  Are in hot temperatures.  Eat healthy foods, such as:  Low-fat (lean) proteins.  Complex carbs (complex carbohydrates), such as whole wheat bread or brown rice.  Fresh fruits and vegetables.  Low-fat dairy products.  Healthy fats.  Drink enough  fluid to keep your pee (urine) clear or pale yellow. If you have diabetes:  Make sure you know the symptoms of hyperglycemia.  Follow your diabetes management plan, as told by your doctor. Make sure you:  Take insulin and medicines as told.  Follow your exercise plan.  Follow your meal plan. Eat on time. Do not skip meals.  Check your blood sugar as often as told. Make sure to check before and after exercise. If you exercise longer or in a different way than you normally do, check your blood sugar more often.  Follow your sick day plan whenever you cannot eat or drink normally. Make this plan ahead of time with your doctor.  Share your diabetes management plan with people in your workplace, school, and household.  Check your urine for ketones when you are ill and as told by your doctor.  Carry a card or wear jewelry that says that you have diabetes. Contact a doctor if:  Your blood sugar level is higher than 240 mg/dL (13.3 mmol/L) for 2 days in a row.  You have problems keeping your blood sugar in your target range.  High blood sugar happens often for you. Get help right away if:  You have trouble breathing.  You have a change in how you think, feel, or act (mental status).  You feel sick to your stomach (nauseous), and that feeling does not go away.  You cannot stop throwing up (vomiting). These symptoms may be an   emergency. Do not wait to see if the symptoms will go away. Get medical help right away. Call your local emergency services (911 in the [place] Do not drive yourself to the hospital.  Summary  Hyperglycemia is when the sugar (glucose) level in your blood is too high.  High blood sugar can happen to people who do or do not have diabetes.  Make sure you drink enough fluids, eat healthy foods, and exercise regularly.  Contact your doctor if you have problems keeping your blood sugar in your target range. This information is not intended to replace advice given  to you by your health care provider. Make sure you discuss any questions you have with your health care provider. Document Released: 10/22/2008 Document Revised: 09/12/2015 Document Reviewed: 09/12/2015 Elsevier Interactive Patient Education  2017 Elsevier Inc.  

## 2016-02-13 NOTE — Progress Notes (Signed)
Subjective:    Patient ID: Martha Mejia, female    DOB: January 04, 1961, 56 y.o.   MRN: [id number]  Hyperlipidemia  This is a chronic problem. The current episode started more than 1 month ago. The problem is controlled. Recent lipid tests were reviewed and are high. She has no history of chronic renal disease, diabetes, hypothyroidism, liver disease, obesity or nephrotic syndrome. Prediabetes. Pertinent negatives include no chest pain, focal sensory loss, focal weakness, leg pain or shortness of breath. Current antihyperlipidemic treatment includes diet change. There are no compliance problems.  Risk factors for coronary artery disease include a sedentary lifestyle and obesity.  Arm Pain   The pain is present in the upper right arm. The quality of the pain is described as aching. The pain is at a severity of 8/10. The pain has been intermittent since the incident. Pertinent negatives include no chest pain, muscle weakness, numbness or tingling. Nothing aggravates the symptoms. She has tried nothing for the symptoms. The treatment provided no relief.   Past Medical History:  Diagnosis Date  . Allergy   . Hyperlipidemia    Past Surgical History:  Procedure Laterality Date  . CESAREAN SECTION    . HERNIA REPAIR    No Known Allergies  Immunization History  Administered Date(s) Administered  . Tdap 12/16/2011   Social History   Social History  . Marital status: Single    Spouse name: N/A  . Number of children: N/A  . Years of education: N/A   Occupational History  . Not on file.   Social History Main Topics  . Smoking status: Never Smoker  . Smokeless tobacco: Never Used  . Alcohol use No  . Drug use: No  . Sexual activity: No   Other Topics Concern  . Not on file   Social History Narrative  . No narrative on file    Review of Systems  Constitutional: Negative for fatigue and fever.  HENT: Negative.   Eyes: Negative for photophobia and visual disturbance.   Respiratory: Negative.  Negative for shortness of breath.   Cardiovascular: Negative.  Negative for chest pain.  Gastrointestinal: Negative.   Endocrine: Negative for cold intolerance, heat intolerance, polydipsia and polyphagia.  Genitourinary: Negative.   Musculoskeletal: Positive for arthralgias (right arm pain).  Skin: Negative.        Acne primarily to face.   Allergic/Immunologic: Negative for immunocompromised state.  Neurological: Negative.  Negative for dizziness, tingling, focal weakness, facial asymmetry, numbness and headaches.  Hematological: Negative.   Psychiatric/Behavioral: Negative.  Negative for sleep disturbance and suicidal ideas.       Objective:   Physical Exam  Constitutional: She is oriented to person, place, and time. She appears well-developed and well-nourished.  HENT:  Head: Normocephalic and atraumatic.  Right Ear: External ear normal.  Left Ear: External ear normal.  Mouth/Throat: Oropharynx is clear and moist.  Eyes: Conjunctivae and EOM are normal. Pupils are equal, round, and reactive to light.  Neck: Normal range of motion. Neck supple.  Abdominal: Soft. Bowel sounds are normal.  Musculoskeletal:       Right shoulder: She exhibits decreased range of motion and pain. She exhibits no swelling and no spasm.  Neurological: She is alert and oriented to person, place, and time. She has normal reflexes.  Skin: Skin is warm and dry.  Psychiatric: She has a normal mood and affect. Her behavior is normal. Judgment and thought content normal.      BP (!) 156/78 (BP  Location: Left Arm, Patient Position: Sitting, Cuff Size: Large)   Pulse 69   Temp 98.6 F (37 C) (Oral)   Resp 18   Wt 171 lb 6.4 oz (77.7 kg)   SpO2 100%   BMI 34.62 kg/m  Assessment & Plan:   1. Arm pain, right - DG Humerus Right; Future - Lipid Panel - HgB A1c - Sedimentation Rate - C-reactive protein - ketorolac (TORADOL) injection 60 mg; Inject 2 mLs (60 mg total) into the  muscle once.  2. Prediabetes Recommend a lowfat, low carbohydrate diet divided over 5-6 small meals, increase water intake to 6-8 glasses, and 150 minutes per week of cardiovascular exercise.    3. Hyperlipidemia, unspecified hyperlipidemia type The patient is asked to make an attempt to improve diet and exercise patterns to aid in medical management of this problem. 4. History of osteopenia  - DG Bone Density; Future  5. Elevated blood pressure reading Return in 1 week for a blood pressure check    RTC: 6 months for chronic conditions   Martha Mejia M, FNP    The patient was given clear instructions to go to ER or return to medical center if symptoms do not improve, worsen or new problems develop. The patient verbalized understanding. Will notify patient with laboratory results.

## 2016-02-14 ENCOUNTER — Other Ambulatory Visit: Payer: Self-pay | Admitting: Family Medicine

## 2016-02-14 DIAGNOSIS — E785 Hyperlipidemia, unspecified: Secondary | ICD-10-CM

## 2016-02-14 LAB — SEDIMENTATION RATE: Sed Rate: 6 mm/hr (ref 0–30)

## 2016-02-14 LAB — C-REACTIVE PROTEIN: CRP: 2.7 mg/L (ref <8.0)

## 2016-02-14 MED ORDER — ATORVASTATIN CALCIUM 20 MG PO TABS: 20.0000 mg | ORAL_TABLET | Freq: Every day | ORAL | 1 refills | Status: AC

## 2016-02-15 ENCOUNTER — Telehealth: Payer: Self-pay

## 2016-02-15 NOTE — Telephone Encounter (Signed)
-----   Message from Massie MaroonLachina M Hollis, [place] sent at 02/14/2016  4:06 PM EST ----- Regarding: lab results Please inform patient that arm xray is negative, no acute abnormalities. Cholesterol remains elevated, will start Atorvastatin 20 mg every evening. Recommend a lowfat, low carbohydrate diet divided over 5-6 small meals, increase water intake to 6-8 glasses, and 150 minutes per week of cardiovascular exercise.   Thanks ----- Message ----- From: Loney HeringLaura E Batten, LPN Sent: 7/8/29562/05/2016  11:36 AM To: Massie MaroonLachina M Hollis, FNP

## 2016-02-15 NOTE — Telephone Encounter (Signed)
Called using interpreter services [employer] ID # [id number]. Spoke with patient advised of normal xray and that cholesterol was elevated. Advised to start atorvastatin 20mg  once daily as directed, to start low carb/low fat diet over 5 to 6 small meals daily, to increase water to 6 to 8 glasses daily and to exercise 150 minutes weekly of cardio. Patient verbalized understanding and was asked to keep follow up appointment. Thanks!

## 2016-03-13 ENCOUNTER — Telehealth: Payer: Self-pay

## 2016-03-13 ENCOUNTER — Other Ambulatory Visit: Payer: Self-pay | Admitting: Family Medicine

## 2016-03-13 DIAGNOSIS — K0889 Other specified disorders of teeth and supporting structures: Secondary | ICD-10-CM

## 2016-03-13 NOTE — Telephone Encounter (Signed)
Martha Mejia, Patient is complaining of dental pain. Can you order dental referral for her? She has orange card.

## 2016-04-16 ENCOUNTER — Encounter: Payer: Self-pay | Admitting: Family Medicine

## 2016-05-23 ENCOUNTER — Encounter: Payer: Self-pay | Admitting: Gynecology

## 2016-08-13 ENCOUNTER — Ambulatory Visit: Payer: No Typology Code available for payment source | Admitting: Family Medicine

## 2017-02-10 IMAGING — DX DG HUMERUS 2V *R*
2 series · 2 of 2 positions shown · non-contrast
Comparison: None in PACs

CLINICAL DATA: Chronic right upper arm pain without known injury.
Duration of symptoms 3 years. Pain is made worse with lifting and
moving the arm.

EXAM:
RIGHT HUMERUS - 2+ VIEW

[humerus ap]
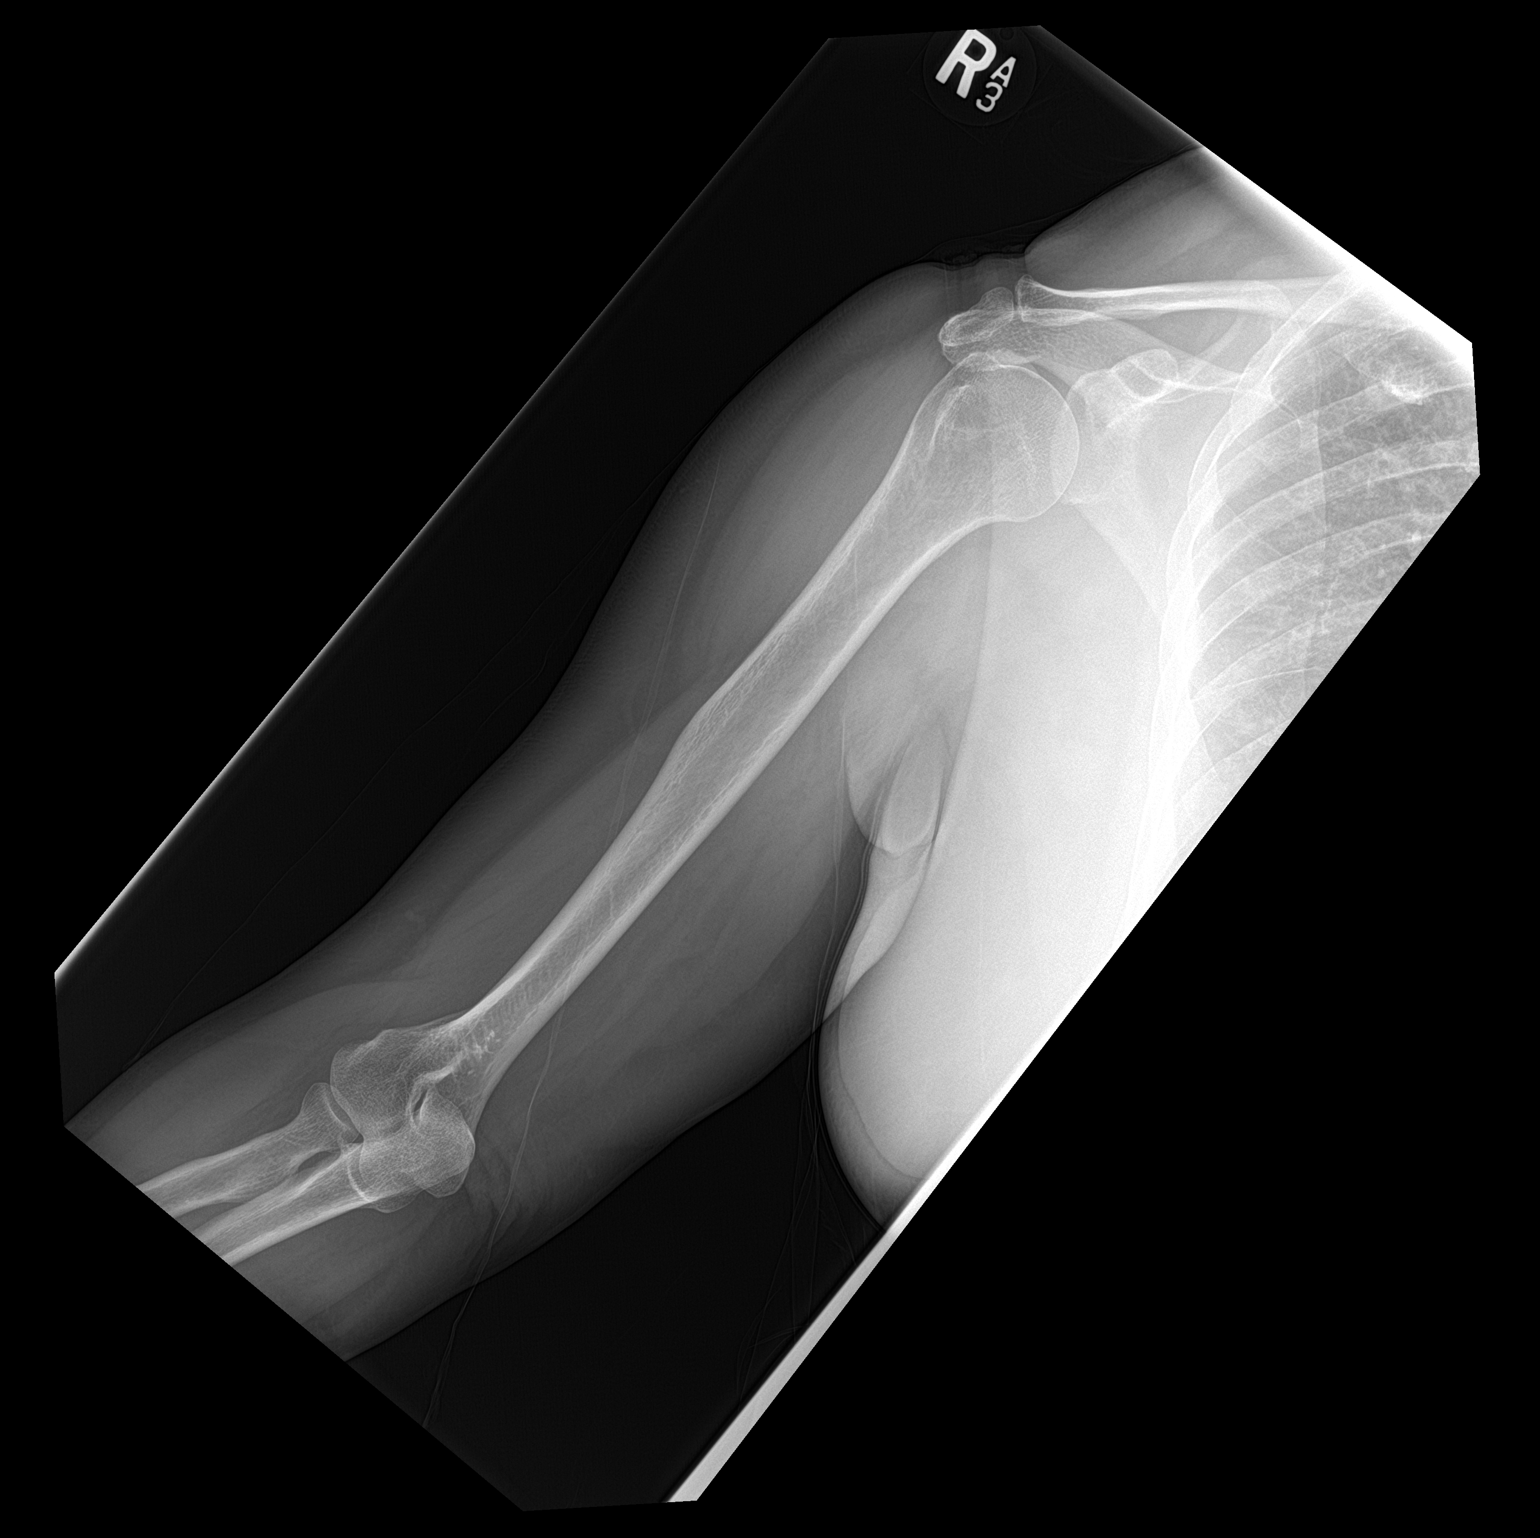

[humerus lat]
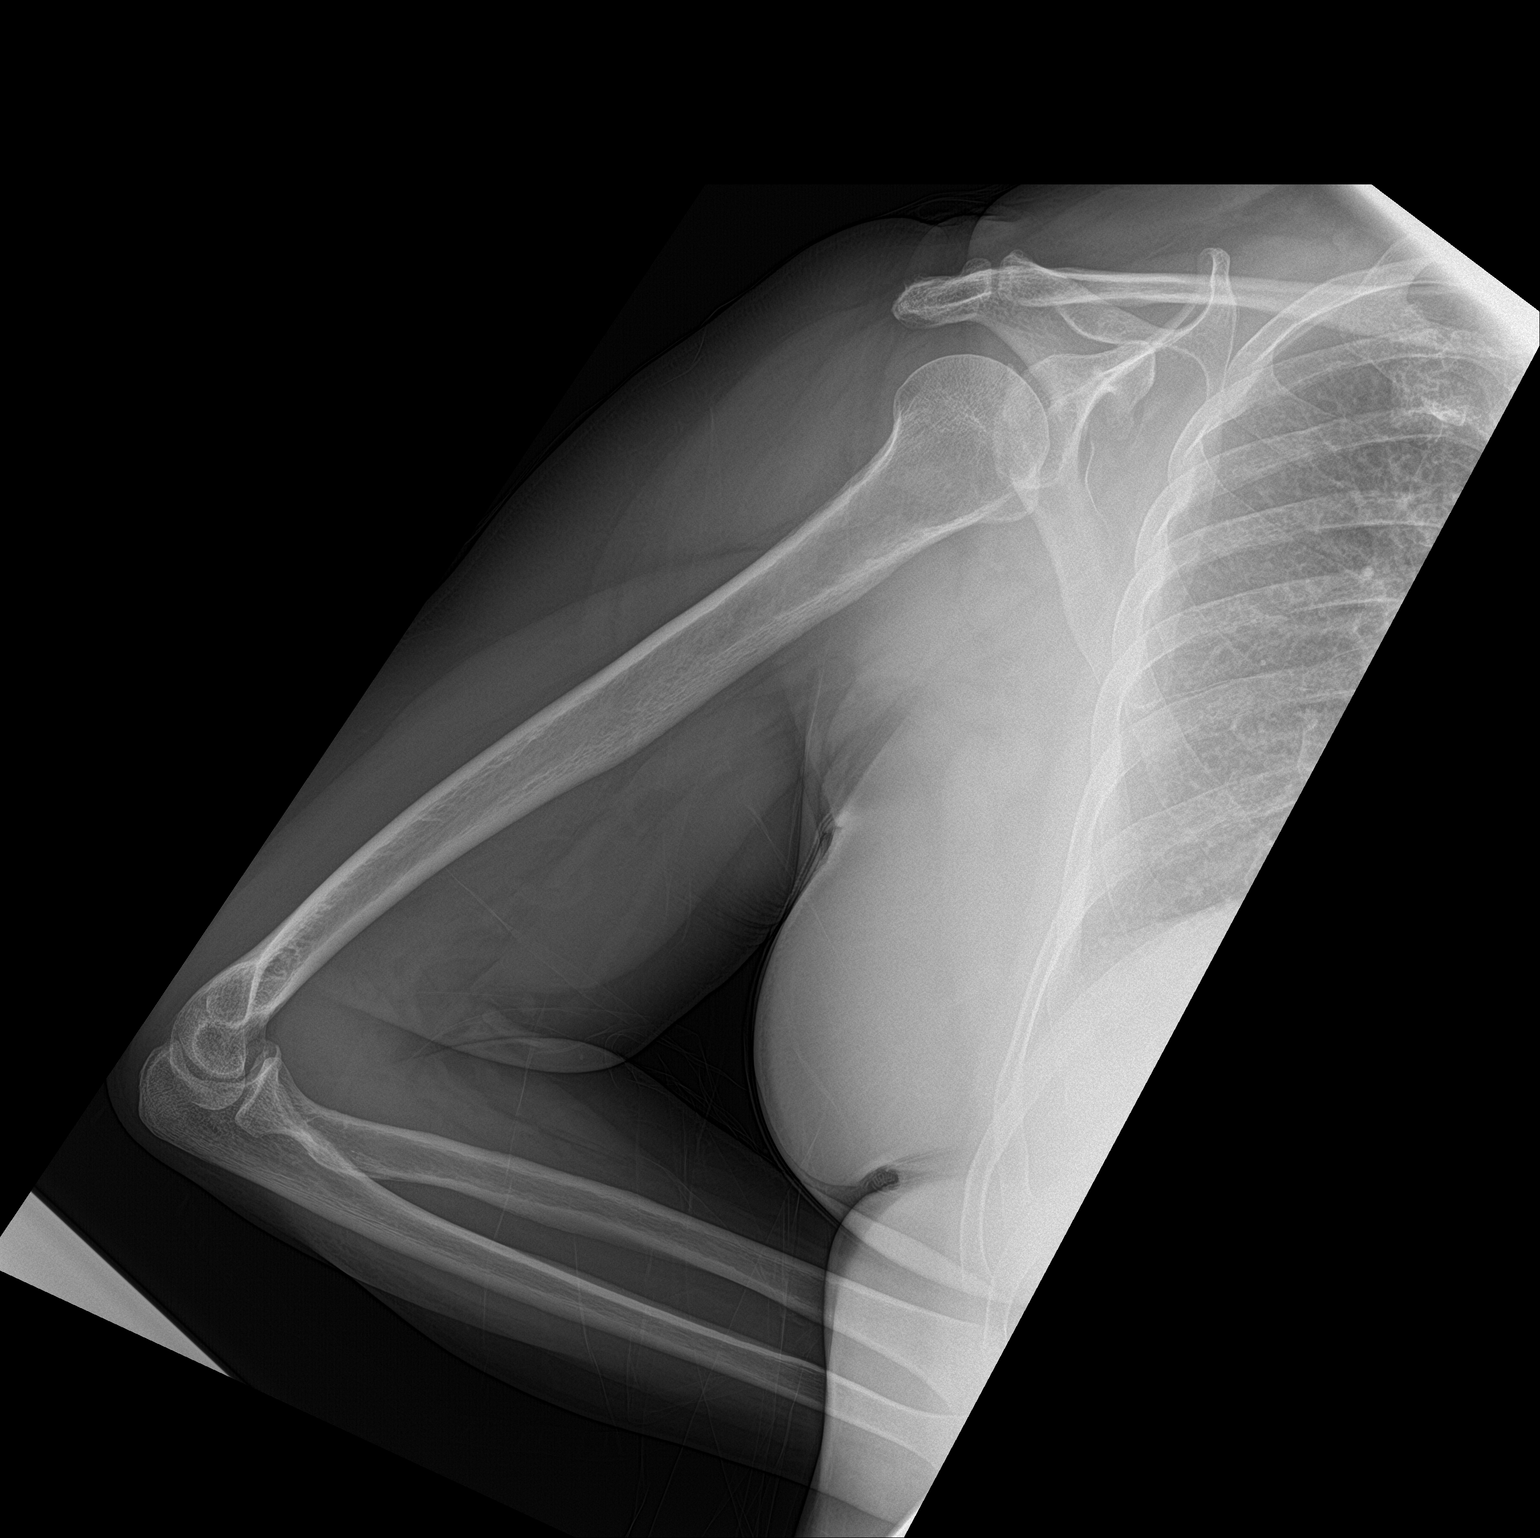

[2 of 2 positions shown; findings below may reference images not displayed]

FINDINGS: The humerus is adequately mineralized. There is no lytic or blastic
lesion. There is no acute or healing fracture. The observed portions
of the right shoulder and right elbow are normal. The overlying soft
tissues are normal.
IMPRESSION: There is no acute or significant chronic bony abnormality of the
right humerus.
